# Patient Record
Sex: Female | Born: 1965 | Race: White | Hispanic: No | Marital: Married | Smoking: Current every day smoker
Health system: Southern US, Community
[De-identification: ages and names within clinical notes are randomized; demographics above are authoritative.]

## PROBLEM LIST (undated history)

## (undated) DIAGNOSIS — E559 Vitamin D deficiency, unspecified: Secondary | ICD-10-CM

## (undated) DIAGNOSIS — B029 Zoster without complications: Secondary | ICD-10-CM

## (undated) DIAGNOSIS — I491 Atrial premature depolarization: Secondary | ICD-10-CM

## (undated) HISTORY — PX: TRANSTHORACIC ECHOCARDIOGRAM: SHX275

## (undated) HISTORY — DX: Atrial premature depolarization: I49.1

## (undated) HISTORY — DX: Vitamin D deficiency, unspecified: E55.9

## (undated) HISTORY — DX: Zoster without complications: B02.9

---

## 2001-09-03 ENCOUNTER — Emergency Department (HOSPITAL_COMMUNITY): Admission: EM | Admit: 2001-09-03 | Discharge: 2001-09-03 | Payer: Self-pay | Admitting: *Deleted

## 2001-11-27 ENCOUNTER — Encounter: Payer: Self-pay | Admitting: Obstetrics and Gynecology

## 2001-11-27 ENCOUNTER — Encounter: Payer: Self-pay | Admitting: Internal Medicine

## 2001-11-27 ENCOUNTER — Emergency Department (HOSPITAL_COMMUNITY): Admission: EM | Admit: 2001-11-27 | Discharge: 2001-11-27 | Payer: Self-pay | Admitting: Internal Medicine

## 2001-12-19 ENCOUNTER — Encounter: Payer: Self-pay | Admitting: Emergency Medicine

## 2001-12-19 ENCOUNTER — Emergency Department (HOSPITAL_COMMUNITY): Admission: EM | Admit: 2001-12-19 | Discharge: 2001-12-19 | Payer: Self-pay | Admitting: Emergency Medicine

## 2002-04-24 ENCOUNTER — Inpatient Hospital Stay (HOSPITAL_COMMUNITY): Admission: RE | Admit: 2002-04-24 | Discharge: 2002-04-25 | Payer: Self-pay | Admitting: Obstetrics & Gynecology

## 2002-04-25 ENCOUNTER — Encounter: Payer: Self-pay | Admitting: Obstetrics & Gynecology

## 2002-05-10 ENCOUNTER — Inpatient Hospital Stay (HOSPITAL_COMMUNITY): Admission: AD | Admit: 2002-05-10 | Discharge: 2002-05-12 | Payer: Self-pay | Admitting: Obstetrics & Gynecology

## 2002-05-13 ENCOUNTER — Inpatient Hospital Stay (HOSPITAL_COMMUNITY): Admission: AD | Admit: 2002-05-13 | Discharge: 2002-05-16 | Payer: Self-pay | Admitting: Obstetrics and Gynecology

## 2002-06-09 ENCOUNTER — Encounter (INDEPENDENT_AMBULATORY_CARE_PROVIDER_SITE_OTHER): Payer: Self-pay | Admitting: Specialist

## 2002-06-09 ENCOUNTER — Inpatient Hospital Stay (HOSPITAL_COMMUNITY): Admission: AD | Admit: 2002-06-09 | Discharge: 2002-06-12 | Payer: Self-pay | Admitting: *Deleted

## 2002-07-16 ENCOUNTER — Emergency Department (HOSPITAL_COMMUNITY): Admission: EM | Admit: 2002-07-16 | Discharge: 2002-07-17 | Payer: Self-pay

## 2003-05-12 ENCOUNTER — Encounter: Payer: Self-pay | Admitting: *Deleted

## 2003-05-12 ENCOUNTER — Emergency Department (HOSPITAL_COMMUNITY): Admission: EM | Admit: 2003-05-12 | Discharge: 2003-05-12 | Payer: Self-pay | Admitting: *Deleted

## 2005-03-06 ENCOUNTER — Emergency Department (HOSPITAL_COMMUNITY): Admission: EM | Admit: 2005-03-06 | Discharge: 2005-03-06 | Payer: Self-pay | Admitting: Emergency Medicine

## 2005-03-06 ENCOUNTER — Ambulatory Visit: Payer: Self-pay | Admitting: Internal Medicine

## 2007-05-21 IMAGING — CR DG NECK SOFT TISSUE
2 series · 2 of 2 positions shown · non-contrast
Comparison: None.

CLINICAL DATA: Swallowed a fish bone.

NECK SOFT TISSUES - 2 VIEW  03/06/2005:

[view not recorded (1 of 2)]
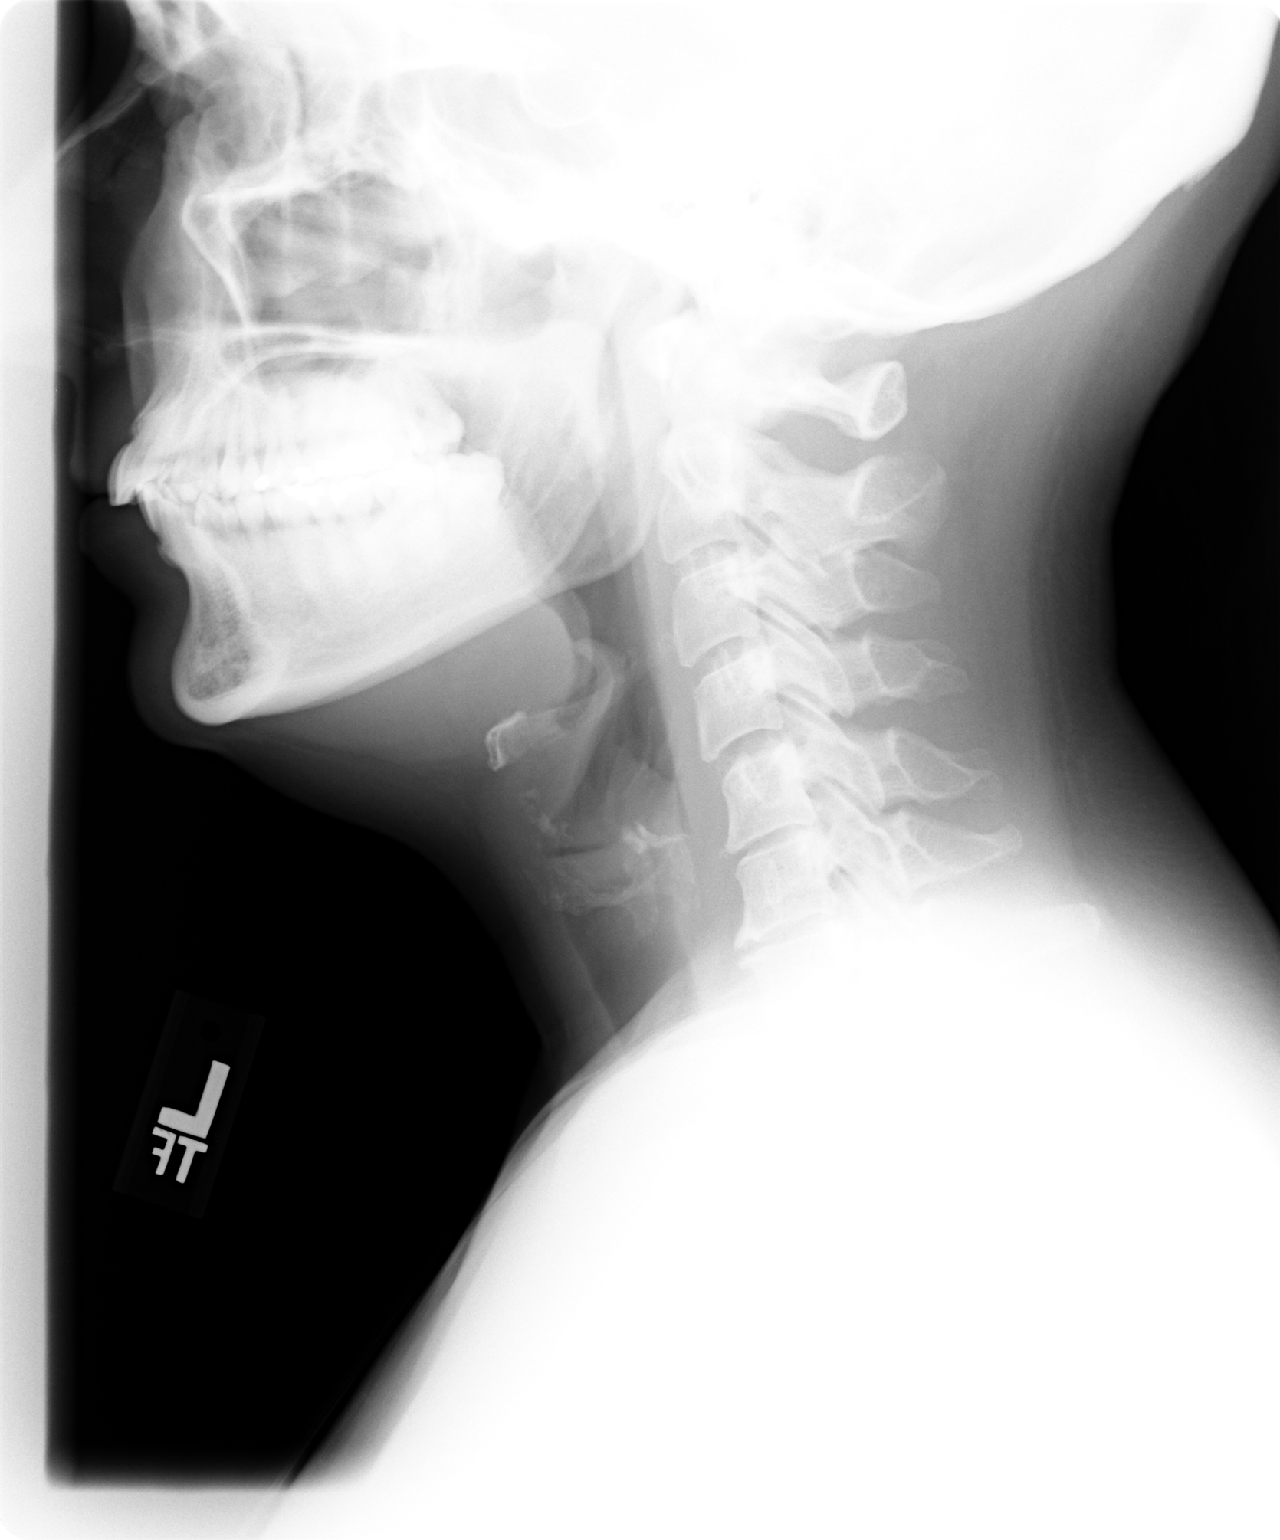

[view not recorded (2 of 2)]
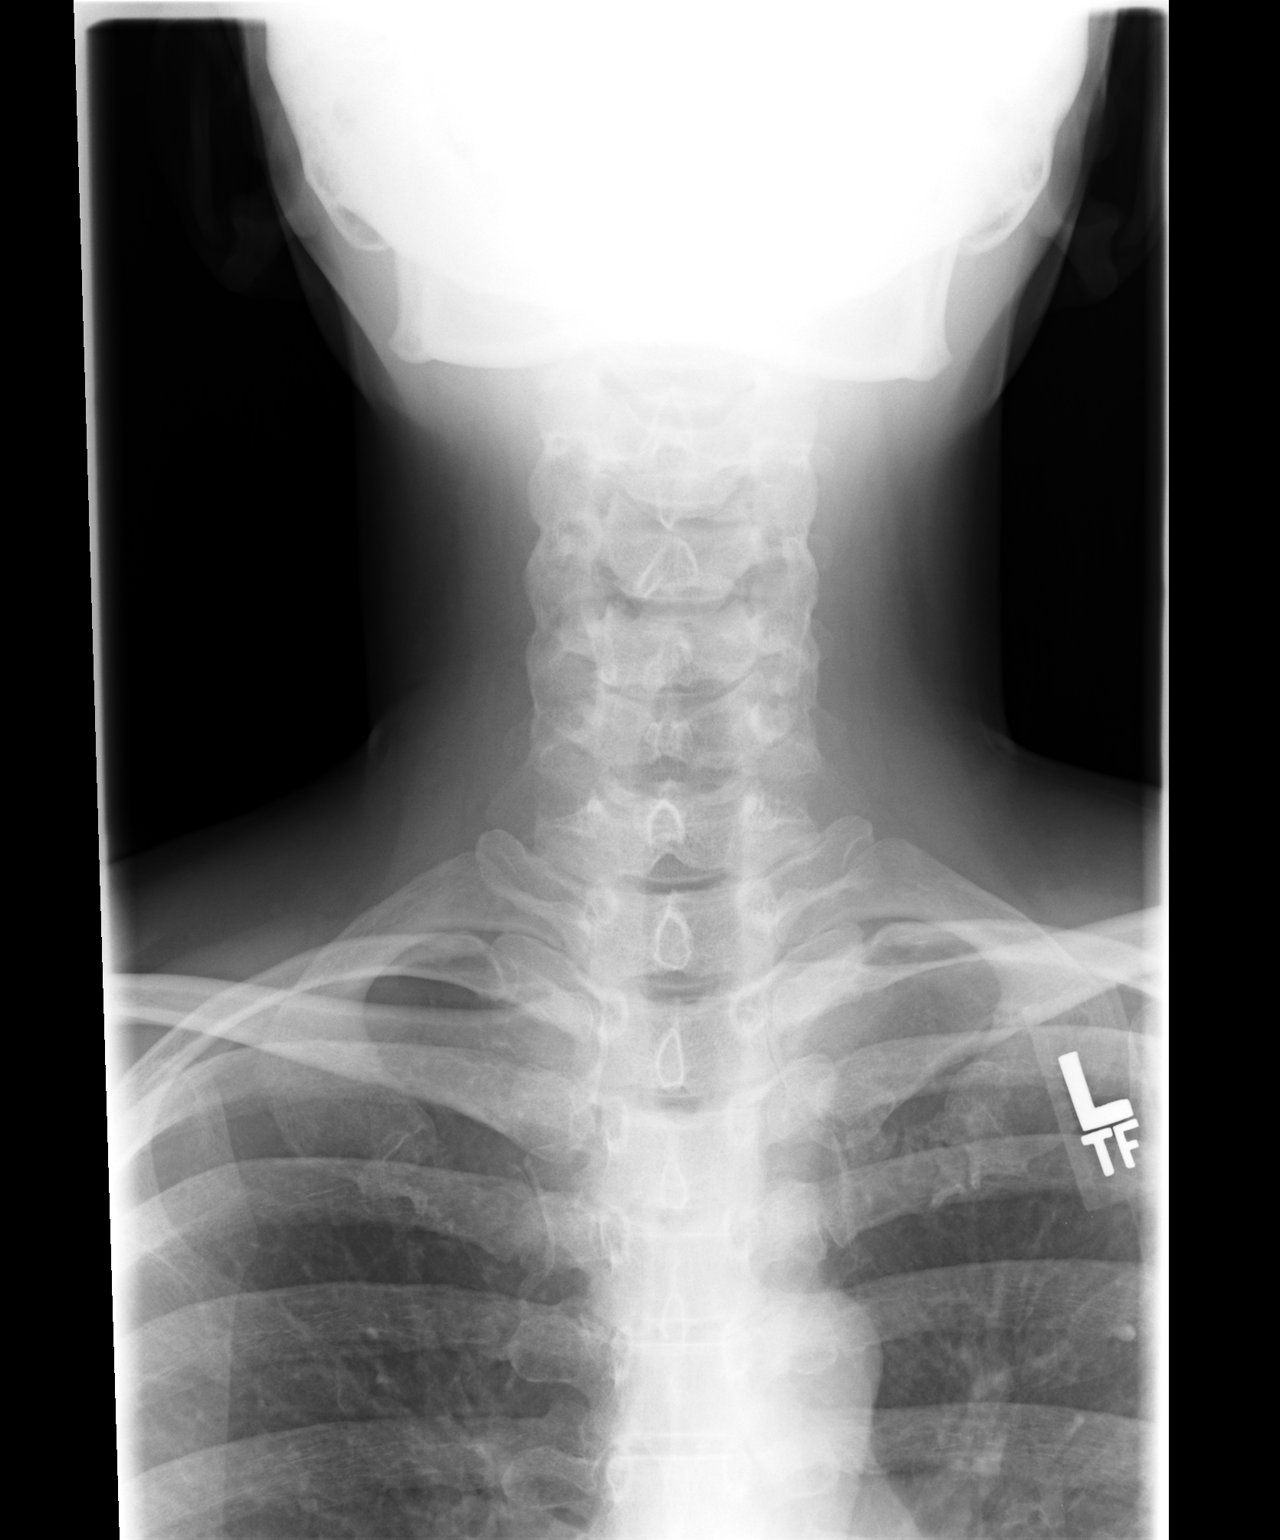

[2 of 2 positions shown; findings below may reference images not displayed]

FINDINGS: No opaque foreign bodies identified within the pharynx that would be
consistent with a fish bone. There is calcification within the arytenoid
cartilages. The prevertebral soft tissues are normal. The epiglottis is normal.
Degenerative disc disease and spondylosis are noted at C5-C6 and C6-C7.
IMPRESSION: 1. Normal soft tissues of the neck. No opaque foreign body is identified that
would be consistent with a fishbone.
2. Degenerative disc disease spondylosis at C5-C6 and C6-C7.

## 2008-04-23 ENCOUNTER — Ambulatory Visit: Payer: Self-pay | Admitting: Cardiology

## 2008-05-16 ENCOUNTER — Ambulatory Visit: Payer: Self-pay | Admitting: Cardiology

## 2008-05-21 ENCOUNTER — Ambulatory Visit: Payer: Self-pay | Admitting: Cardiology

## 2009-05-29 DIAGNOSIS — R079 Chest pain, unspecified: Secondary | ICD-10-CM

## 2009-05-29 DIAGNOSIS — I491 Atrial premature depolarization: Secondary | ICD-10-CM

## 2011-01-20 NOTE — Assessment & Plan Note (Signed)
[facility]                          [facility] OFFICE NOTE   NAME:Blanchard, Janice RAUTIO                      MRN:          [id number]  DATE:05/21/2008                            DOB:          10/06/1965    Janice Blanchard is 45 years of age.  I had seen her in consultation at  [facility] on April 24, 2008.  At that time, she had some chest  discomfort.  Her cardiac markers were normal.  She had some PACs and  PVCs.  She was extremely bothered by her palpitations.  Her echo showed  normal LV function.  It was felt that she could be discharged and that  an outpatient CardioNet monitor would help [place] assess her further.  The  patient went home and has been fully active.  She wore a CardioNet  monitor and she called in many many times with skipped beats and  palpitations.  Everyone of these episodes revealed PACs.  There was rare  atrial bigeminy.  There were rare PVCs.  There was no sustained  supraventricular tachycardia.   The patient is really very bothered by her palpitations.   The patient's husband is here with her.  They have stopped smoking since  she was in the hospital.  She is concerned about gaining weight, but she  is committed to not smoking.  This clearly increases her stress level  yet she is doing the best she can.  She has not had any significant  chest pain.  There has been no syncope or presyncope.   PAST MEDICAL HISTORY:   ALLERGIES:  No known drug allergies.   MEDICATIONS:  She is on no medicines at this time.   OTHER MEDICAL PROBLEMS:  See the list below.   REVIEW OF SYSTEMS:  Other than the HPI, her review of systems is  negative.   PHYSICAL EXAMINATION:  VITAL SIGNS:  Blood pressure is 112/78 with a  pulse of 81.  Weight is 188 pounds.  GENERAL:  The patient is oriented to person, time, and place.  Affect is  normal.  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  NECK:  She has no carotid bruits.  There is no jugular  venous  distention.  LUNGS:  Clear.  Respiratory effort is not labored.  CARDIAC:  An S1 with an S2.  There are no clicks or significant murmurs.  ABDOMEN:  Soft.  She is overweight.  She has no significant peripheral  edema.   The event recorder is outlined above.  She is sent in 6 extensive number  of reports with skipped beats and she has PACs and rare atrial bigeminy.   PROBLEMS:  1. Atypical chest discomfort with normal cardiac markers in the      hospital.  At some point, we may consider an exercise test but not      at this point.  2. Gastroesophageal reflux disease.  3. Tobacco use which she just stopped at home with her husband.  4. Anxiety.  5. Very symptomatic atrial premature beats.  I spoke with the patient  and her husband at great length.  I tried to reassure them that I      felt that this rhythm was not dangerous to her.  She should      continue full activities.  We talked about whether any medications      would help.  She is really not taking medicines.  However, I think      that she is very symptomatic.  Along with reassurance, I suggested      that we try a low dose of metoprolol succinate to see if this helps      her.  We will start this and I will see her for early followup.  6. Question of xantheloma.  We will re-review her hospital lipids and      consider outpatient lipids, if she may need treatment.   I will see her back for early followup to see how she is responding to  the metoprolol.   I spent an extensive amount of time reviewing and re-reviewing the basis  of her rhythm and talking about the treatment.     Janice Abed, MD, [facility]  Electronically Signed    JDK/MedQ  DD: 05/21/2008  DT: 05/22/2008  Job #: [id number]

## 2011-01-23 NOTE — Discharge Summary (Signed)
   NAME:  Janice Blanchard, Janice Blanchard                         ACCOUNT NO.:  [id number]   MEDICAL RECORD NO.:  [id number]                   PATIENT TYPE:  INP   LOCATION:  9309                                 FACILITY:  [facility]   PHYSICIAN:  Conni Elliot, M.D.             DATE OF BIRTH:  1965-12-30   DATE OF ADMISSION:  06/09/2002  DATE OF DISCHARGE:  06/12/2002                                 DISCHARGE SUMMARY   DISCHARGE DIAGNOSES:  1. Status post low transverse cesarean section secondary to breech     presentation with twin gestation.  2. Delivery of female twin infants.  3. Status post bilateral tubal ligation.   DISCHARGE MEDICATIONS:  1. Ibuprofen 600 mg p.o. q.6h. p.r.n. pain.  2. Prenatal vitamins one p.o. q.d. x6 weeks.  3. FeSo4 325 mg p.o. t.i.d. with meals x6 weeks.  4. Stool softener p.r.n.  5. Percocet 5/325 one to two tablets p.o. q.4h. p.r.n. severe pain.   FOLLOW UP:  The patient is to follow up in six weeks at [facility].   ADMISSION HISTORY AND PHYSICAL:  A 45 year old G58, P2-1-0-3 presenting at [redacted]  weeks gestation with a history of preterm labor, twin gestation, and breech  presentation presenting in active labor.  The patient taken for cesarean  section.   HOSPITAL COURSE:  Repeat LTCS performed under spinal anesthesia.  The  patient had no significant problems in her postoperative or postpartum  course.  She did have a postpartum tubal ligation.  The patient was  discharged to home on postoperative day three without any difficulties.  Staples were removed prior to discharge with Steri-Strips placed.   DISCHARGE LABORATORIES:  Hemoglobin 8.1, platelets 427,000, WBC 11.3.  RPR  nonreactive.   DISPOSITION:  The patient was discharged to home without further incident.     Jonah Blue, M.D.                      Conni Elliot, M.D.    Milas Gain  D:  07/02/2002  T:  07/03/2002  Job:  [id number]   cc:   [facility]

## 2011-01-23 NOTE — Op Note (Signed)
   NAME:  Janice Blanchard, Janice Blanchard                         ACCOUNT NO.:  [id number]   MEDICAL RECORD NO.:  [id number]                   PATIENT TYPE:  OIB   LOCATION:  A421                                 FACILITY:  [facility]   PHYSICIAN:  Conni Elliot, M.D.             DATE OF BIRTH:  [birth year]   DATE OF PROCEDURE:  06/09/2002  DATE OF DISCHARGE:  06/09/2002                                 OPERATIVE REPORT   PREOPERATIVE DIAGNOSES:  Intrauterine pregnancy at approximately [redacted] weeks  gestation in active labor, twin gestation, breech presenting.  The patient  desires surgical sterilization.   POSTOPERATIVE DIAGNOSES:  Intrauterine pregnancy at approximately [redacted] weeks  gestation in active labor, twin gestation, breech presenting.  The patient  desires surgical sterilization.   OPERATION:  1. Repeat cesarean delivery.  2. Modified bilateral Pomeroy tubal ligation.   SURGEON:  Conni Elliot, M.D., Phil D. Okey Dupre, M.D.   ANESTHESIA:  The patient received spinal anesthetic.   OPERATIVE FINDINGS:  Baby A was a female with Apgars of 8 and 9.  Baby B was  a female with Apgars of 8 and 9.   OPERATIVE PROCEDURE:  The patient was taken to the operating room in active  labor.  Spinal anesthetic was placed.  The patient supine left lateral tilt  position receiving oxygen.  Abdomen was prepped and draped in sterile  fashion.  Low transverse Pfannenstiel incision was made through skin and  subcutaneous fascia.  Rectus muscles separated in midline.  Peritoneal  cavity entered.  Bladder flap created.  Low transverse uterine incision was  made.  Baby A was delivered from breech presentation without difficulty.  No  entrapment of fetal head.  Apgars were 8 and 9.  Second baby was identified  and brought into the operative field and was delivered vertex without  difficulty.  Apgars of 8 and 9.  Placenta was delivered spontaneously.  Uterus and bladder flap closed in routine fashion.  We then turned to  the  tubal sterilization.  The right fallopian tube was identified, grasped with  Babcock clamp, followed out to fimbriated end.  Segment of tube brought into  operative field, double suture ligated, 1-2 cm segment was excised.  Hemostasis adequate.  Similar procedure done on the opposite side.  Hemostasis again adequate.  Anterior peritoneal fascia, subcutaneous, and  skin  closed in routine fashion.  Estimated blood loss approximately 1000  cc.                                               Conni Elliot, M.D.    ASG/MEDQ  D:  06/29/2002  T:  06/29/2002  Job:  [id number]

## 2011-01-23 NOTE — H&P (Signed)
   NAME:  Janice Blanchard, Janice Blanchard                         ACCOUNT NO.:  [id number]   MEDICAL RECORD NO.:  [id number]                   PATIENT TYPE:  INP   LOCATION:  9309                                 [facility]ACILITY[facility]  [facility]   PHYSICIAN:  Lazaro Arms, M.D.                DATE OF BIRTH:  1966-09-02   DATE OF ADMISSION:  06/09/2002  DATE OF DISCHARGE:  06/12/2002                                HISTORY & PHYSICAL   HISTORY OF PRESENT ILLNESS:  The patient is a 45 year old white female,  gravida 4, para 3, with an estimated date of delivery of 07/26/02 currently  at [redacted] weeks gestation, who has twins.  The patient has been admitted several  times with premature labor and, in fact, that started at 26 weeks.  She has  been in these in 3 separate occasions.  She comes in this morning laboring  at 4 cm.  She is a previous C-section x1.  She has been taking her  terbutaline and her contractions are regular.  She is intact and, because of  early gestation she is being transferred to [facility] and that has  been approved by Dr. Corky Sox, who is the head of Maternal Fetal  Medicine there.   PAST MEDICAL HISTORY:  Benign.   PAST SURGICAL HISTORY:  She had C-section in 1992.   ALLERGIES:  None.   PAST OBSTETRICAL HISTORY:  She has 2 vaginal deliveries and the 1 C-section  for breach.   REVIEW OF SYSTEMS:  Otherwise negative.   PHYSICAL EXAMINATION:  HEENT:  Unremarkable.  Thyroid is normal.  LUNGS:  Clear.  HEART:  Regular rate and rhythm without regurg or gallop.  BREASTS:  Deferred.  ABDOMEN:  Gravid, fundal height of 42 cm.  Cervix is 450 and 0 station.  EXTREMITIES:  With no edema.  NEUROLOGIC:  Status virtually intact.   IMPRESSION:  1. Intrauterine pregnancy at [redacted] weeks gestation.  2. Twins.    PLAN:  The patient is being transferred to [facility] for delivery because she  is in pre-term labor and delivery is going to be  imminent.  She is a  previous C-section and that will be  discussed down at [facility].  Dr. Gavin Potters  is accepting.                                                 Lazaro Arms, M.D.    Loraine Maple  D:  06/28/2002  T:  06/29/2002  Job:  [id number]

## 2011-01-23 NOTE — Op Note (Signed)
NAME:  Janice Blanchard, Janice Blanchard               ACCOUNT NO.:  [id number]   MEDICAL RECORD NO.:  [id number]          PATIENT TYPE:  EMS   LOCATION:  ED                            FACILITY:  [facility]   PHYSICIAN:  R. Roetta Sessions, M.D. DATE OF BIRTH:  April 14, 1966   DATE OF PROCEDURE:  03/06/2005  DATE OF DISCHARGE:                                 OPERATIVE REPORT   PROCEDURE:  Esophagogastroduodenoscopy with removal of foreign body.   INDICATIONS FOR PROCEDURE:  This 45 year old lady who was doing fine until  this evening when she was eating at [employer].  Was eating some  fried flounder and felt that a bone got stuck in the base of her throat.  Although she perceives it has migrated around in her throat, it remains in  her throat.  She came to see Dr. Margretta Ditty in the emergency department.  Soft  tissue neck film was negative for foreign body.  EGD is now being done.  This approach has been discussed with the patient at length.  Potential  risks, benefits and alternatives have been reviewed and questions answered.  Please see the documentation on the medical record.   PROCEDURE NOTE:  O2 saturation, blood pressure, pulses, respirations  monitored throughout the entire procedure.  Cetacaine spray for topical  oropharyngeal anesthesia.   INSTRUMENT:  Olympus videochip system.   SEDATION:  Conscious sedation with Versed and Demerol in incremental doses.   FINDINGS:  Examination of the hypopharynx revealed a very tiny, what  appeared to be a translucent bone approximately 8-10 mm x 1.5 cm in  diameter.  It was contiguous with some mucus.  It was very subtle.  It was  brought out with a rat-tooth forceps.  Cursory examination of the esophagus  revealed significant, what appeared to be distal esophageal erosions and a  small nodule in the distal esophagus.  The stomach was full of food.  No  further attempts at complete exam were undertaken.  Patient tolerated the  procedure well.   ENDOSCOPY  IMPRESSION:  1.  Tiny fish bone removed from the hypopharynx, as described above.  2.  Small nodule, distal esophagus, inflammatory changes consistent with      erosive reflux esophagitis.  3.  Stomach full of food, precluding a complete exam.   RECOMMENDATIONS:  1.  Chloraseptic spray as needed for sore throat over the next couple of      days.  2.  Patient really should come back for a complete examination and biopsy of      the nodule in the distal esophagus.  I will make arrangements for her to      have this done in the next couple of weeks.       RMR/MEDQ  D:  03/06/2005  T:  03/07/2005  Job:  [id number]   cc:   Annia Friendly. Loleta Chance, MD  [address]  [place]  [place] [address]  Fax: [phone]

## 2011-01-23 NOTE — Discharge Summary (Signed)
   NAME:  EMER, ONNEN                         ACCOUNT NO.:  [id number]   MEDICAL RECORD NO.:  [id number]                   PATIENT TYPE:  INP   LOCATION:  A416                                 FACILITY:  [facility]   PHYSICIAN:  Lazaro Arms, M.D.                DATE OF BIRTH:  1966/08/30   DATE OF ADMISSION:  05/12/2002  DATE OF DISCHARGE:  05/16/2002                                 DISCHARGE SUMMARY   DISCHARGE DIAGNOSES:  1. Intrauterine pregnancy at 29.[redacted] weeks gestation.  2. Twins.  3. Preterm labor.   PROCEDURES:  1. Admission to the hospital.  2. Interpretation of NSTs.  3. Daily care and discharge care.  4. Magnesium tocolysis.   Please refer to the transcribed History and Physical and the antepartum  chart for details of admission to the hospital.   HOSPITAL COURSE:  The patient was admitted on May 13, 2002 with  recurrent preterm labor.  She had been on terbutaline at home and also been  on magnesium sulfate previously.  She came back in about 12 hours post  discharge and once again had contractions.  She had to be put back on  magnesium and she responded and was placed back on Brethine again, and her  cervix remained stable at a fingertip, pretty good thick, and -3.  The twins  were reactive.   DISPOSITION:  She was discharged home on p.o. terbutaline.                                               Lazaro Arms, M.D.    Loraine Maple  D:  07/06/2002  T:  07/07/2002  Job:  [id number]

## 2011-01-23 NOTE — H&P (Signed)
   NAME:  MIRAL, HOOPES                         ACCOUNT NO.:  [id number]   MEDICAL RECORD NO.:  [id number]                  PATIENT TYPE:   LOCATION:                                       FACILITY:   PHYSICIAN:  Zerita Boers, N.M.                DATE OF BIRTH:   DATE OF ADMISSION:  04/24/2002  DATE OF DISCHARGE:                                HISTORY & PHYSICAL   HISTORY OF PRESENT ILLNESS:  Janice Blanchard is a 45 year old gravida 4, para 3, with  a twin gestation at 66 weeks and some odd days, who was admitted to  observation on August 17 for preterm labor contractions.  The patient was  placed on observation and given subcu terbutaline and placed on p.o.  terbutaline with good resolution of contractions and the patient had a semi-  restful night, was somewhat jittery off the Brethine 5.  She had no  contractions during the course of the day, but at approximately 4:30 to 5  o'clock, began having regular contractions about 5-6 minutes apart, with  just mild to moderate intensity.   PAST MEDICAL HISTORY:  Negative.   PAST SURGICAL HISTORY:  Positive for a C-section in August 1992.   ALLERGIES:  She has no known allergies.   MEDICATIONS:  She is not on any medications right now except for Brethine,  which she was started on in the hospital.   PRENATAL COURSE:  Has been complicated by twin gestation and advanced  maternal age.  She has had a history of first trimester bleeding at six  weeks.  Also she refused or declined amniocentesis for an abnormal AFP.   PHYSICAL EXAMINATION:  VITAL SIGNS:  Weight is 188 pounds, vital signs are  stable.  PELVIC:  Cervix has progressed from firm, thick, posterior, and presenting  part high to soft, tight fingertip which is no change in dilatation, but  cervix has softened, and presenting part continues to be high.  Contractions  are 5-6 minutes apart.   PLAN:  Consulted with Dr. Emelda Fear.  He is in agreement to start preterm  labor protocol and  to start her on  ampicillin 2 g, then 1 g q.4h.  Also we  are going to start her back on subcu terbutaline.  If that does not resolve,  we then will progress to magnesium sulfate therapy.                                                 Zerita Boers, N.M.   DL/MEDQ  D:  16/06/9603  T:  04/24/2002  Job:  [id number]   cc:   [facility]

## 2011-01-23 NOTE — H&P (Signed)
NAME:  Janice Blanchard, Janice Blanchard                         ACCOUNT NO.:  [id number]   MEDICAL RECORD NO.:  [id number]                   PATIENT TYPE:  OIB   LOCATION:  A416                                 FACILITY:  [facility]   PHYSICIAN:  Tilda Burrow, M.D.              DATE OF BIRTH:  [birth year]   DATE OF ADMISSION:  05/12/2002  DATE OF DISCHARGE:                                HISTORY & PHYSICAL   ADMISSION DIAGNOSIS:  1. Recurrent preterm labor, twin gestation 60-weeks.  2. History of prior cesarean section for scheduled repeat cesarean section     and tubal ligation.   HISTORY OF PRESENT ILLNESS:  This 45 year old female, gravida 4, para 3, AB  0 known LMP 10/19/01 placing EDC of 11/19 with ultrasound at 19 weeks  suggesting a 16 AGC was admitted at this time after returning to labor and  delivery 12 hours after her most recent discharge from the hospital for  persistent preterm labor.  The patient was on Brethine 5 mg p.o. q.6h. at  home, but had return of her contractions shortly after arrival at home.  She  returned to labor and delivery where subcu terbutaline was ineffectual at  reducing contractions.  Cervix, fortunately was not changed from last visit.  She was admitted and placed on magnesium sulfate therapy at that time.   PAST MEDICAL HISTORY:  Benign.   PAST SURGICAL HISTORY:  Cesarean section in 08/92.   ALLERGIES:  None known.   SOCIAL HISTORY:  Married.  Lives in the area.  Has supportive family members  here.   GYNECOLOGICAL HISTORY:  Notable first delivery was at 32-36 weeks delivering  a 4 pound 10 ounce female infant, had premature lung problems as well as has  had problems throughout life, has had 3 liver transplants.  The patient has  a high anxiety level which she attributes somewhat to the first child's  problems.  This pregnancy has been marked by significantly greater  discomfort than the patient was able to easily tolerate.  She has had at  least 3  admissions for preterm labor.  Interestingly the patient had preterm  labor symptoms several times with her last pregnancy which was in 1992 but  ultimately delivered at scheduled cesarean section at term for breech  infant.   PHYSICAL EXAMINATION:   VITAL SIGNS:  Height 5 feet 3 inches, weight 180.  Blood pressure 110/60.   ABDOMEN:  Abdominal exam shows a large gravid uterus, fundal height 37 cm.  Estimated fetal weight 3 weeks ago was approximately 3 pounds each with  symmetric growth needed on ultrasound.  Fetal heart rates are both reactive  today.  External monitoring, on admission, shows regular uterine  contractions warranting intervention.   ASSESSMENT:  Pregnancy [redacted] weeks gestation, twin gestation, recurrent preterm  labor without rupture of membranes.   PLAN:  Magnesium sulfate therapy tocolysis.  Will keep the patient in  hospital until she has done well, at least 24 hours on oral agents before  consideration outpatient therapy.  If contractions do not persist do not  resolve a transfer may be necessary.                                               Tilda Burrow, M.D.    JVF/MEDQ  D:  05/14/2002  T:  05/14/2002  Job:  [id number]

## 2011-01-23 NOTE — Discharge Summary (Signed)
   NAME:  Janice Blanchard, Janice Blanchard                         ACCOUNT NO.:  [id number]   MEDICAL RECORD NO.:  [id number]                   PATIENT TYPE:  INP   LOCATION:  A417                                 FACILITY:  [facility]   PHYSICIAN:  Jacklyn Shell, CNM       DATE OF BIRTH:  1966-01-24   DATE OF ADMISSION:  04/23/2002  DATE OF DISCHARGE:  04/25/2002                                 DISCHARGE SUMMARY   ADMISSION DIAGNOSIS:  Preterm contractions.   DISCHARGE DIAGNOSES:  1. Preterm contractions.  2. Twin gestation at 26 weeks.   COURSE OF HOSPITAL STAY:  The patient received several doses of subcu  terbutaline to stop contractions that were mild in nature, identifiable by  her, and picked up on the electronic fetal monitoring.  She was placed on  p.o. terbutaline with two breakthroughs requiring one to two subcu  injections of terbutaline.  She has been on p.o. Brethine all day with  minimal uterine activity in past 12 hours.  Her labs were essentially normal  except for a potassium of 3.2 for which she received p.o. potassium while in  the hospital.  She was placed on prophylactic antibiotics and received 24  hours of Ampicillin.  An ultrasound today did assess the fetus AFI and  cervical length.  Both fetuses were normal, 95th percentile, AFI was normal,  and the cervical length was slightly shortened at 2.7 cm.  The cervix  appears to be stable from the documentation from the admitting physician as  being tight fingertip, thick, posterior, high presenting part.  There is  some question as to whether consistency change from firm to thick.  However,  it is stable today.   PLAN:  Discharge patient home after consultation with Dr. Emelda Fear.   FOLLOW-UP:  In one week for pelvic ultrasound to evaluate cervical length.   MEDICATIONS:  The patient is being sent home with prescription for Brethine  5 mg p.o. q.6h.                                               Jacklyn Shell, CNM    FC/MEDQ  D:  04/25/2002  T:  04/26/2002  Job:  [id number]

## 2011-01-23 NOTE — Discharge Summary (Signed)
   NAME:  Janice Blanchard, Janice Blanchard                         ACCOUNT NO.:  [id number]   MEDICAL RECORD NO.:  [id number]                   PATIENT TYPE:  INP   LOCATION:  A420                                 FACILITY:  [facility]   PHYSICIAN:  Lazaro Arms, M.D.                DATE OF BIRTH:  [birth year]   DATE OF ADMISSION:  05/09/2002  DATE OF DISCHARGE:  05/12/2002                                 DISCHARGE SUMMARY   DISCHARGE DIAGNOSES:  1. Intrauterine pregnancy at 28.[redacted] weeks gestation.  2. Twins.  3. Preterm labor.   PROCEDURES:  1. On May 09, 2002 admission by Dr. Christin Bach.  2. On May 10, 2002 and May 11, 2002, daily care by Dr. Despina Hidden.  3. On May 12, 2002 discharge management by Dr. Despina Hidden.   HISTORY:  Please refer to the transcribed History and Physical and the  antepartum chart for details of admission to the hospital.   HOSPITAL COURSE:  The patient was admitted with preterm labor again.  She  had been at home on terbutaline.  She is 28 weeks with twins.  The patient  was begun on magnesium sulfate tocolysis which worked.  She was then placed  on Indocin but she broke through.  She was placed on magnesium tocolysis  again for 24 hours and placed on p.o. terbutaline.  She was given Celestone  12 mg IM x2 doses separated by 24 hours.  She was also on IV Unasyn.  Group  B strep culture was negative.   DISPOSITION:  The patient was discharged May 12, 2002 having no  contractions on p.o. terbutaline.  She was given five Ambien to help her  sleep the next two nights because she has difficulty with than on  terbutaline initially, Zithromax, and Tussionex.  She will be seen back in  the office next Wednesday and she was given instructions and precautions for  return prior to that time.                                               Lazaro Arms, M.D.    Loraine Maple  D:  05/12/2002  T:  05/12/2002  Job:  [id number]

## 2011-01-23 NOTE — H&P (Signed)
[facility]  Patient:    Janice Blanchard, Janice Blanchard Visit Number: [id number] MRN: [id number]          Service Type: EMS Location: ED Attending Physician:  Cassell Smiles. Dictated by:   Christin Bach, M.D. Admit Date:  11/27/2001 Discharge Date: 11/27/2001                           History and Physical  CHIEF COMPLAINT:  Early pregnancy with passage of tissue.  HISTORY OF PRESENT ILLNESS:  A 45 year old female gravida 4, para 3, pregnancy test positive at home this past week presents to emergency room after passage of a figurative size portion of what she thought was tissue, after an otherwise uneventful pregnancy course.  Unplanned pregnancy but patient interested.  The patient had no prior bleeding, no recent intercourse, or trauma.  PAST MEDICAL HISTORY:  Benign.  SOCIAL HISTORY: Divorced, current partner also legally separated awaiting divorce.  OB HISTORY: Gravida 2-1-0-3, with 1 preterm delivery of her first child with subsequent "daughter who is all messed up".  The daughter has a severe hearing deficit and had liver transplant x 3 all within 1 month but has subsequently done well at age 65.  The other 2 children were delivered at term, with the second child requiring terbutaline at times for tocolysis but delivering at term. The third child delivered by C-section.  PAST SURGICAL HISTORY:  C-section x 1.  PHYSICAL EXAMINATION:  Reveals a nervous , generally healthy Caucasian female, alert, oriented x 3.  GYN EXAMINATION:  Per Dr. Sherwood Gambler.  Cervix closed and uterus grossly normal in size.  Ultrasound shows a twin gestation with 2 fetal poles noted and 2 yolk sacs present.  LABORATORY DATA:  Hemoglobin 13, hematocrit 36, white count 6,400. Quantitative hCG [id number] with blood type A positive.  No bleeding noted at time of ultrasound.  IMPRESSION:  Intrauterine pregnancy [redacted] weeks gestation, twin pregnancy.  Low likelihood of  miscarriage.  PLAN: 1. Prenatal vitamins initiated. 2. Prenatal 1 labs ordered.  FOLLOWUP:  Follow up in one week in our office for initiation of prenatal care which will include consideration of amniocentesis for genetic indications. The patient has multiple questions regarding need for C-section versus VBAC and encouraged to consider repeat cesarean.  The patient will consider tubal ligation at time of C-section.  The patient given information regarding amniocentesis, versus MSAFP at her age. Dictated by:   Christin Bach, M.D. Attending Physician:  Cassell Smiles DD:  11/27/01 TD:  11/28/01 Job: [id number]

## 2011-02-11 ENCOUNTER — Emergency Department (HOSPITAL_COMMUNITY)
Admission: EM | Admit: 2011-02-11 | Discharge: 2011-02-12 | Disposition: A | Discharge status: Home / Self Care | Payer: 59 | Attending: Emergency Medicine | Admitting: Emergency Medicine

## 2011-02-11 ENCOUNTER — Emergency Department (HOSPITAL_COMMUNITY): Payer: 59

## 2011-02-11 DIAGNOSIS — R220 Localized swelling, mass and lump, head: Secondary | ICD-10-CM | POA: Insufficient documentation

## 2011-02-11 DIAGNOSIS — J3489 Other specified disorders of nose and nasal sinuses: Secondary | ICD-10-CM | POA: Insufficient documentation

## 2011-02-11 DIAGNOSIS — R29810 Facial weakness: Secondary | ICD-10-CM | POA: Insufficient documentation

## 2011-02-11 DIAGNOSIS — G510 Bell's palsy: Secondary | ICD-10-CM | POA: Insufficient documentation

## 2011-02-11 DIAGNOSIS — K089 Disorder of teeth and supporting structures, unspecified: Secondary | ICD-10-CM | POA: Insufficient documentation

## 2013-04-27 IMAGING — CT CT HEAD W/O CM
1 series · 16 of 30 positions shown, 20 images · non-contrast
Comparison: None.

CLINICAL DATA: Left-sided facial numbness today.  Left upper tooth
pain and facial and gum swelling.

CT HEAD WITHOUT CONTRAST
TECHNIQUE: Contiguous axial images were obtained from the base of
the skull through the vertex without contrast.

[Series 2: headseq 4.8 h37s · axial · 0.49mm/px · z∈[+120,+283]mm · 16 of 36 slices shown, 20 images]
[im 2/36  brain]
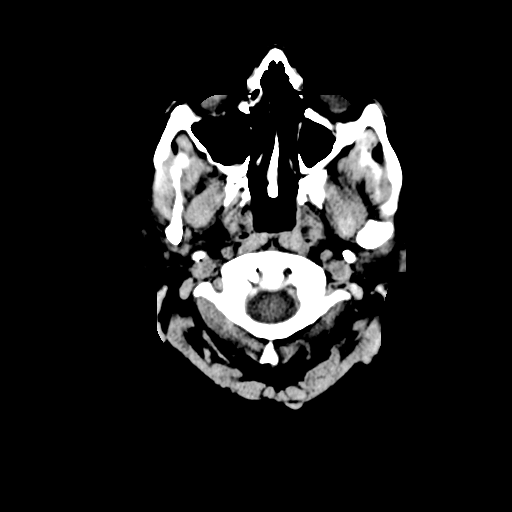
[im 2/36  bone]
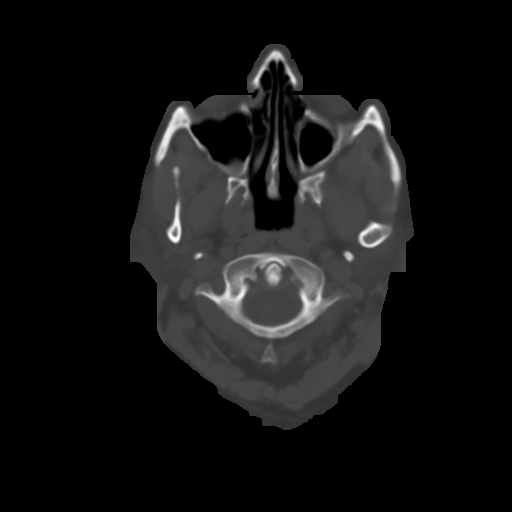
[im 4/36  brain]
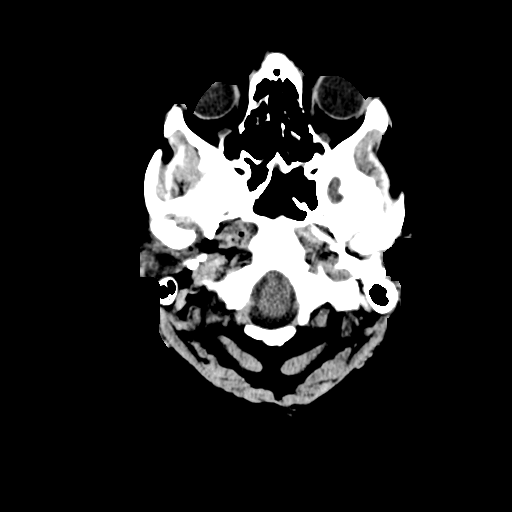
[im 7/36  brain]
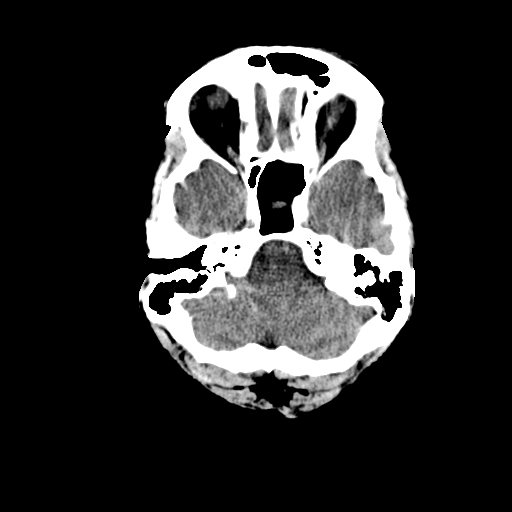
[im 9/36  brain]
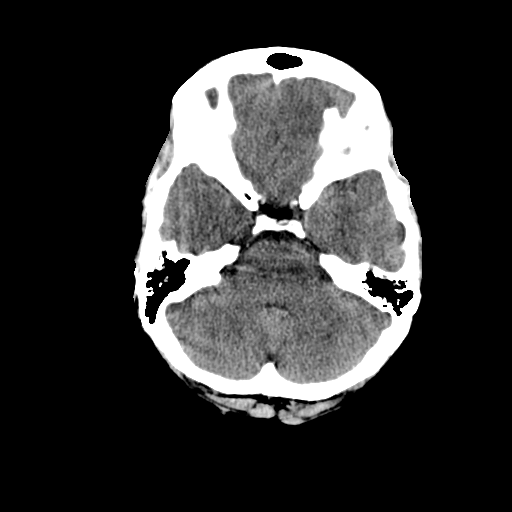
[im 10/36  brain]
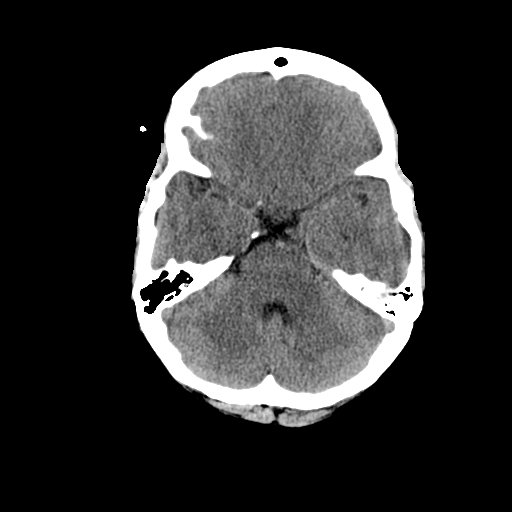
[im 10/36  bone]
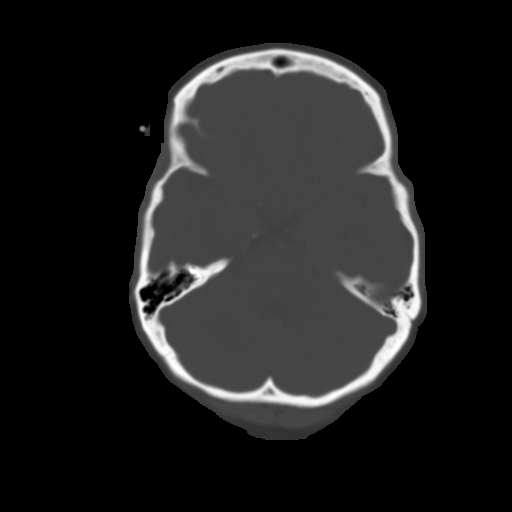
[im 13/36  brain]
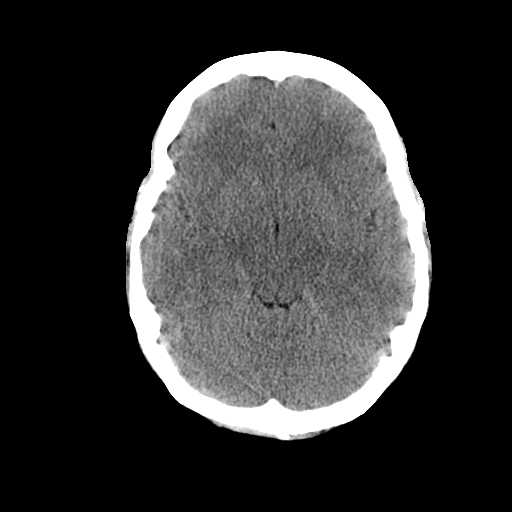
[im 15/36  brain]
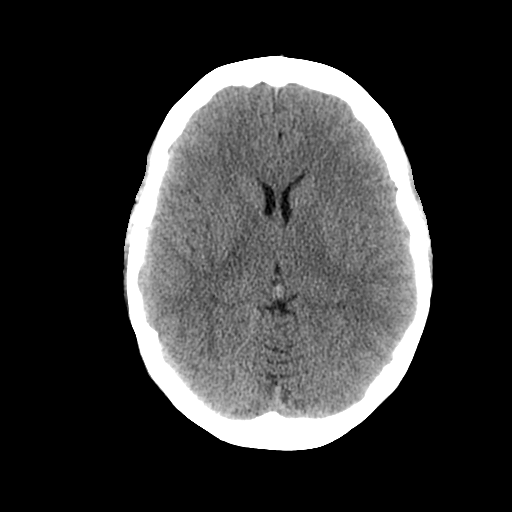
[im 17/36  brain]
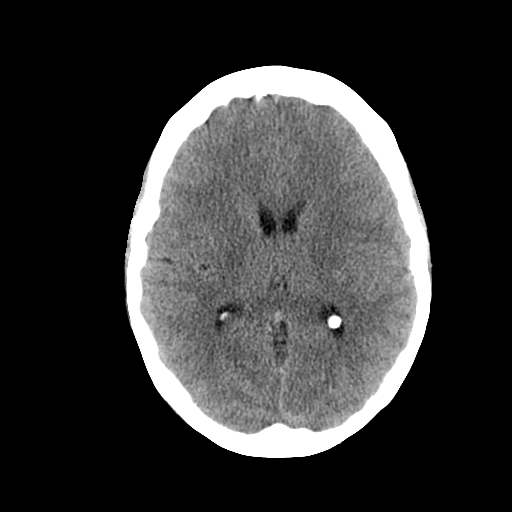
[im 19/36  brain]
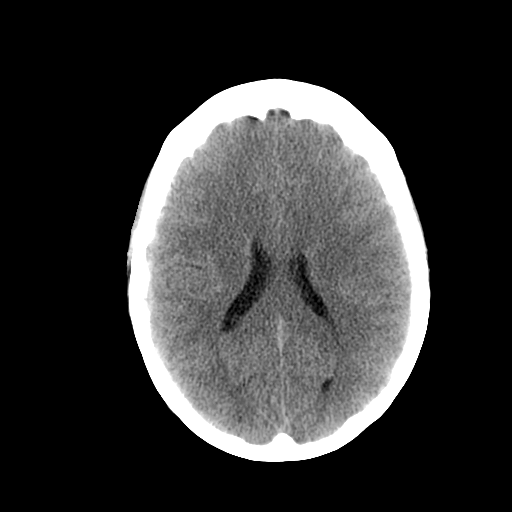
[im 19/36  bone]
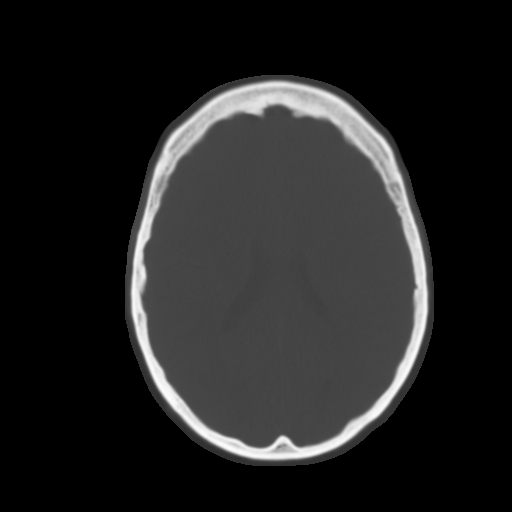
[im 21/36  brain]
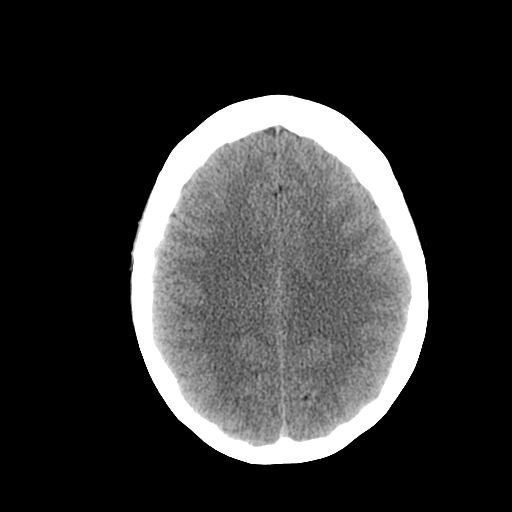
[im 23/36  brain]
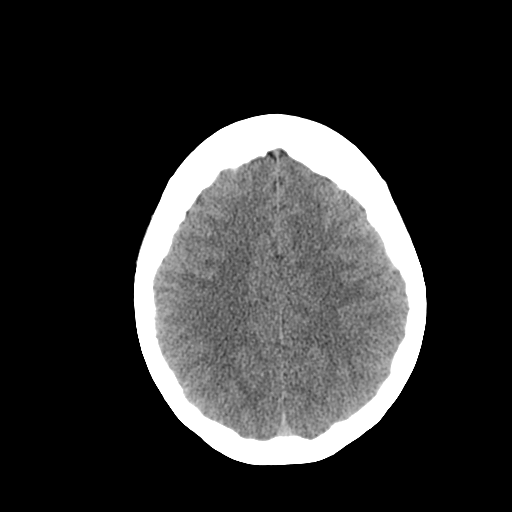
[im 26/36  brain]
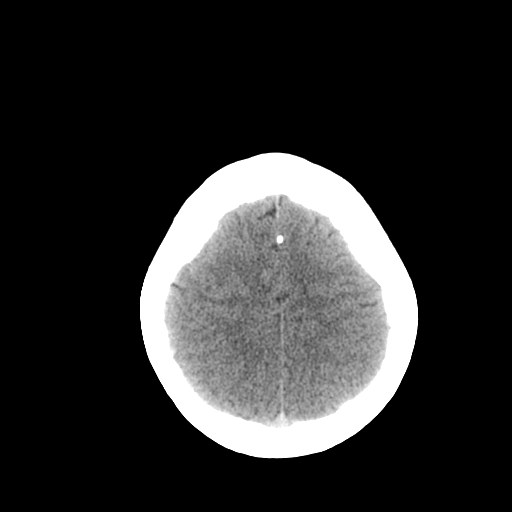
[im 27/36  brain]
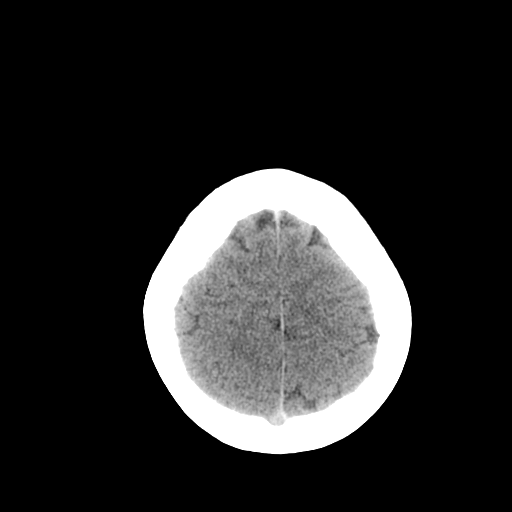
[im 27/36  bone]
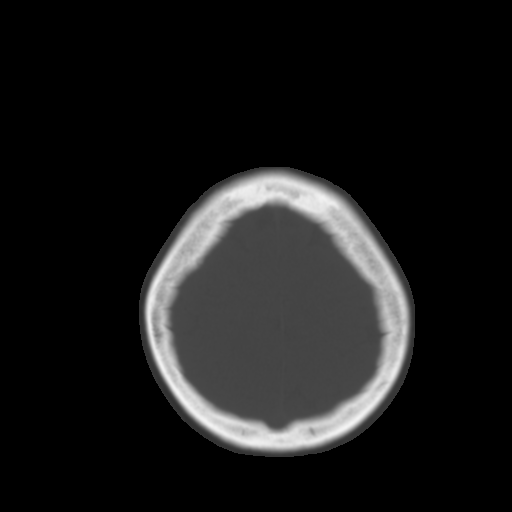
[im 29/36  brain]
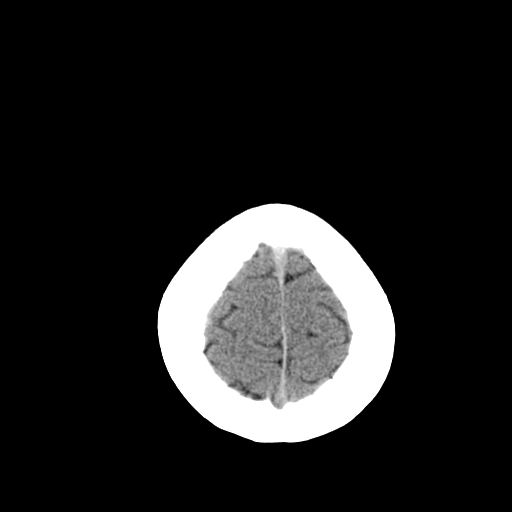
[im 32/36  brain]
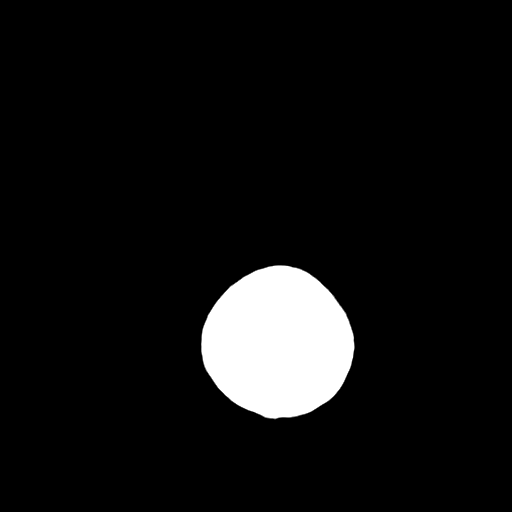
[im 34/36  brain]
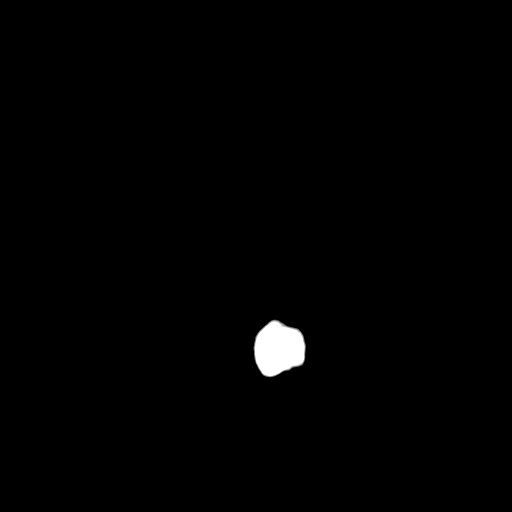

[16 of 30 positions shown; findings below may reference images not displayed]

FINDINGS: The ventricles and sulci are symmetrical without
significant effacement, displacement, or dilatation. No mass effect
or midline shift. No abnormal extra-axial fluid collections. The
grey-white matter junction is distinct. Basal cisterns are not
effaced. No acute intracranial hemorrhage. No depressed skull
fractures.  Retention cyst or polyp in the right maxillary antrum.
No acute air-fluid levels in the visualized paranasal sinuses.
IMPRESSION: No evidence of acute intracranial hemorrhage, mass lesion, or acute
infarct.  Retention cyst in the right maxillary sinus.

## 2014-01-05 ENCOUNTER — Ambulatory Visit (INDEPENDENT_AMBULATORY_CARE_PROVIDER_SITE_OTHER): Payer: 59 | Admitting: Family Medicine

## 2014-01-05 ENCOUNTER — Encounter: Payer: Self-pay | Admitting: Family Medicine

## 2014-01-05 VITALS — BP 121/82 | HR 105 | Temp 97.7°F | Resp 18 | Ht 65.5 in | Wt 196.0 lb

## 2014-01-05 DIAGNOSIS — M222X2 Patellofemoral disorders, left knee: Principal | ICD-10-CM

## 2014-01-05 DIAGNOSIS — E782 Mixed hyperlipidemia: Secondary | ICD-10-CM

## 2014-01-05 DIAGNOSIS — H0260 Xanthelasma of unspecified eye, unspecified eyelid: Secondary | ICD-10-CM

## 2014-01-05 DIAGNOSIS — Z0000 Encounter for general adult medical examination without abnormal findings: Secondary | ICD-10-CM

## 2014-01-05 DIAGNOSIS — M222X1 Patellofemoral disorders, right knee: Secondary | ICD-10-CM

## 2014-01-05 DIAGNOSIS — Z23 Encounter for immunization: Secondary | ICD-10-CM

## 2014-01-05 DIAGNOSIS — M25569 Pain in unspecified knee: Secondary | ICD-10-CM

## 2014-01-05 DIAGNOSIS — M222X9 Patellofemoral disorders, unspecified knee: Secondary | ICD-10-CM

## 2014-01-05 NOTE — Progress Notes (Signed)
Office Note 01/10/2014  CC:  Chief Complaint  Patient presents with  . Establish Care    HPI:  [name] is a 48 y.o. White female who is here to establish care. Patient's most recent primary MD: Dr. Olena Blanchard Old records were not reviewed prior to or during today's visit.  Some soft tissue bumpiness around eyelids for years now, no pain.  "I hate them".  Lipids normal in remote past when these first appeared --presumed xanthelasma.  C/o bilat knee cap discomfort, mild, feels like they are weak, esp when getting up and down and going up and down steps.  No hx of knee injury.  No swelling, erythema, or warmth.  Past Medical History  Diagnosis Date  . PAC (premature atrial contraction)     symptomatic.  Holter monitor showed PACs.  Never took meds.  Spontaneously resolved.    Past Surgical History  Procedure Laterality Date  . Cesarean section      2003 and 1992    Family History  Problem Relation Age of Onset  . Alcohol abuse Father   . Deafness Daughter   . Hepatitis Daughter     autoimmune  . Alzheimer's disease Maternal Grandfather     History   Social History  . Marital Status: Married    Spouse Name: Janice Blanchard    Number of Children: Janice Blanchard  . Years of Education: Janice Blanchard   Occupational History  . Not on file.   Social History Main Topics  . Smoking status: Former Smoker    Quit date: 01/05/2009  . Smokeless tobacco: Never Used  . Alcohol Use: No  . Drug Use: No  . Sexual Activity: Not on file   Other Topics Concern  . Not on file   Social History Narrative   Married, 5 children.   Homemaker.   Orig from [place].   Currently living in [place], [place].   20 pack-yr hx.  Quit 2009.   No alcohol or drugs.   Exercise: no formal exercise but not sedentary.   MEDS: none  No Known Allergies  ROS Review of Systems  Constitutional: Negative for fever and fatigue.  HENT: Negative for congestion and sore throat.   Eyes: Negative for visual disturbance.   Respiratory: Negative for cough.   Cardiovascular: Negative for chest pain.  Gastrointestinal: Negative for nausea and abdominal pain.  Genitourinary: Negative for dysuria.  Musculoskeletal: Negative for back pain and joint swelling.  Skin: Negative for rash.  Neurological: Negative for weakness and headaches.  Hematological: Negative for adenopathy.    PE; Blood pressure 121/82, pulse 105, temperature 97.7 F (36.5 C), temperature source Temporal, resp. rate 18, height 5' 5.5" (1.664 m), weight 196 lb (88.905 kg), SpO2 97.00%. Gen: Alert, well appearing.  Patient is oriented to person, place, time, and situation. AFFECT: pleasant, lucid thought and speech. WUJ:WJXBENT:Eyes: no injection, icteris, swelling, or exudate.  EOMI, PERRLA.  Light yellow, oblong skin lesions on upper and lower eyelids bilat, soft and nontender. Mouth: lips without lesion/swelling.  Oral mucosa pink and moist. Oropharynx without erythema, exudate, or swelling.  Neck - No masses or thyromegaly or limitation in range of motion CV: RRR, no m/r/g.   LUNGS: CTA bilat, nonlabored resps, good aeration in all lung fields. EXT: no clubbing, cyanosis, or edema.  Knee exam - both normal; full range of motion, no pain on motion, no effusion, tenderness, masses, ligamentous instability or deformity noted.  Pertinent labs:  None today  ASSESSMENT AND PLAN:   New  pt: establishing care, obtain old records.  Patellofemoral pain syndrome Discussed stretching/strengthening activities for medial quads, NSAIDs prn-sparingly.  Xanthelasma, eyelid No other areas of the body affected. Check FLP. No specific treatment rendered for this condition today.  Preventative health care Tdap IM today. Ordered health panel labs today-future, when fasting.  An After Visit Summary was printed and given to the patient.  Return for office f/u prn but needs lab appt for fasting labs at her convenience (orders entered).

## 2014-01-10 DIAGNOSIS — H0260 Xanthelasma of unspecified eye, unspecified eyelid: Secondary | ICD-10-CM | POA: Insufficient documentation

## 2014-01-10 DIAGNOSIS — M222X9 Patellofemoral disorders, unspecified knee: Secondary | ICD-10-CM | POA: Insufficient documentation

## 2014-01-10 DIAGNOSIS — Z0000 Encounter for general adult medical examination without abnormal findings: Secondary | ICD-10-CM | POA: Insufficient documentation

## 2014-01-10 NOTE — Assessment & Plan Note (Signed)
No other areas of the body affected. Check FLP. No specific treatment rendered for this condition today.

## 2014-01-10 NOTE — Assessment & Plan Note (Signed)
Discussed stretching/strengthening activities for medial quads, NSAIDs prn-sparingly.

## 2014-01-10 NOTE — Assessment & Plan Note (Addendum)
Tdap IM today. Ordered health panel labs today-future, when fasting.

## 2014-01-12 ENCOUNTER — Other Ambulatory Visit (INDEPENDENT_AMBULATORY_CARE_PROVIDER_SITE_OTHER): Payer: 59

## 2014-01-12 DIAGNOSIS — Z0000 Encounter for general adult medical examination without abnormal findings: Secondary | ICD-10-CM

## 2014-01-12 LAB — COMPREHENSIVE METABOLIC PANEL
ALBUMIN: 4 g/dL (ref 3.5–5.2)
ALK PHOS: 55 U/L (ref 39–117)
ALT: 20 U/L (ref 0–35)
AST: 19 U/L (ref 0–37)
BUN: 7 mg/dL (ref 6–23)
CO2: 28 mEq/L (ref 19–32)
Calcium: 9.3 mg/dL (ref 8.4–10.5)
Chloride: 107 mEq/L (ref 96–112)
Creatinine, Ser: 0.8 mg/dL (ref 0.4–1.2)
GFR: 84.55 mL/min (ref >60.00)
Glucose, Bld: 95 mg/dL (ref 70–99)
POTASSIUM: 4.5 meq/L (ref 3.5–5.1)
SODIUM: 141 meq/L (ref 135–145)
TOTAL PROTEIN: 6.5 g/dL (ref 6.0–8.3)
Total Bilirubin: 0.7 mg/dL (ref 0.2–1.2)

## 2014-01-12 LAB — CBC WITH DIFFERENTIAL/PLATELET
BASOS PCT: 0.5 % (ref 0.0–3.0)
Basophils Absolute: 0 10*3/uL (ref 0.0–0.1)
EOS PCT: 5.4 % — AB (ref 0.0–5.0)
Eosinophils Absolute: 0.3 10*3/uL (ref 0.0–0.7)
HEMATOCRIT: 41.6 % (ref 36.0–46.0)
Hemoglobin: 14 g/dL (ref 12.0–15.0)
LYMPHS ABS: 2.2 10*3/uL (ref 0.7–4.0)
Lymphocytes Relative: 37.5 % (ref 12.0–46.0)
MCHC: 33.7 g/dL (ref 30.0–36.0)
MCV: 99.5 fl (ref 78.0–100.0)
MONO ABS: 0.3 10*3/uL (ref 0.1–1.0)
Monocytes Relative: 6 % (ref 3.0–12.0)
Neutro Abs: 2.9 10*3/uL (ref 1.4–7.7)
Neutrophils Relative %: 50.6 % (ref 43.0–77.0)
PLATELETS: 285 10*3/uL (ref 150.0–400.0)
RBC: 4.18 Mil/uL (ref 3.87–5.11)
RDW: 13.3 % (ref 11.5–15.5)
WBC: 5.8 10*3/uL (ref 4.0–10.5)

## 2014-01-12 LAB — LIPID PANEL
Cholesterol: 190 mg/dL (ref 0–200)
HDL: 43.2 mg/dL (ref >39.00)
LDL CALC: 131 mg/dL — AB (ref 0–99)
Total CHOL/HDL Ratio: 4
Triglycerides: 79 mg/dL (ref 0.0–149.0)
VLDL: 15.8 mg/dL (ref 0.0–40.0)

## 2014-01-12 LAB — TSH: TSH: 2.38 u[IU]/mL (ref 0.35–4.50)

## 2014-01-24 ENCOUNTER — Inpatient Hospital Stay (HOSPITAL_COMMUNITY)
Admission: EM | Admit: 2014-01-24 | Discharge: 2014-01-26 | DRG: 392 | Disposition: A | Discharge status: Home / Self Care | Payer: 59 | Attending: Internal Medicine | Admitting: Internal Medicine

## 2014-01-24 ENCOUNTER — Encounter (HOSPITAL_COMMUNITY): Payer: Self-pay | Admitting: Emergency Medicine

## 2014-01-24 ENCOUNTER — Emergency Department (HOSPITAL_COMMUNITY): Payer: 59

## 2014-01-24 DIAGNOSIS — A09 Infectious gastroenteritis and colitis, unspecified: Principal | ICD-10-CM | POA: Diagnosis present

## 2014-01-24 DIAGNOSIS — Z79899 Other long term (current) drug therapy: Secondary | ICD-10-CM

## 2014-01-24 DIAGNOSIS — E782 Mixed hyperlipidemia: Secondary | ICD-10-CM | POA: Diagnosis present

## 2014-01-24 DIAGNOSIS — K644 Residual hemorrhoidal skin tags: Secondary | ICD-10-CM | POA: Diagnosis present

## 2014-01-24 DIAGNOSIS — Z820 Family history of epilepsy and other diseases of the nervous system: Secondary | ICD-10-CM

## 2014-01-24 DIAGNOSIS — R197 Diarrhea, unspecified: Secondary | ICD-10-CM

## 2014-01-24 DIAGNOSIS — K529 Noninfective gastroenteritis and colitis, unspecified: Secondary | ICD-10-CM | POA: Diagnosis present

## 2014-01-24 DIAGNOSIS — Z87891 Personal history of nicotine dependence: Secondary | ICD-10-CM

## 2014-01-24 DIAGNOSIS — H0260 Xanthelasma of unspecified eye, unspecified eyelid: Secondary | ICD-10-CM | POA: Diagnosis present

## 2014-01-24 DIAGNOSIS — R109 Unspecified abdominal pain: Secondary | ICD-10-CM

## 2014-01-24 DIAGNOSIS — K625 Hemorrhage of anus and rectum: Secondary | ICD-10-CM

## 2014-01-24 DIAGNOSIS — R079 Chest pain, unspecified: Secondary | ICD-10-CM

## 2014-01-24 DIAGNOSIS — K5900 Constipation, unspecified: Secondary | ICD-10-CM | POA: Diagnosis present

## 2014-01-24 DIAGNOSIS — Z6379 Other stressful life events affecting family and household: Secondary | ICD-10-CM

## 2014-01-24 DIAGNOSIS — K921 Melena: Secondary | ICD-10-CM

## 2014-01-24 LAB — CBC WITH DIFFERENTIAL/PLATELET
BASOS ABS: 0 10*3/uL (ref 0.0–0.1)
Basophils Relative: 0 % (ref 0–1)
EOS PCT: 1 % (ref 0–5)
Eosinophils Absolute: 0.1 10*3/uL (ref 0.0–0.7)
HEMATOCRIT: 43.5 % (ref 36.0–46.0)
Hemoglobin: 14.6 g/dL (ref 12.0–15.0)
LYMPHS PCT: 13 % (ref 12–46)
Lymphs Abs: 1.8 10*3/uL (ref 0.7–4.0)
MCH: 32.7 pg (ref 26.0–34.0)
MCHC: 33.6 g/dL (ref 30.0–36.0)
MCV: 97.5 fL (ref 78.0–100.0)
MONO ABS: 0.6 10*3/uL (ref 0.1–1.0)
MONOS PCT: 5 % (ref 3–12)
Neutro Abs: 11.7 10*3/uL — ABNORMAL HIGH (ref 1.7–7.7)
Neutrophils Relative %: 81 % — ABNORMAL HIGH (ref 43–77)
Platelets: 330 10*3/uL (ref 150–400)
RBC: 4.46 MIL/uL (ref 3.87–5.11)
RDW: 13 % (ref 11.5–15.5)
WBC: 14.3 10*3/uL — AB (ref 4.0–10.5)

## 2014-01-24 LAB — URINALYSIS, ROUTINE W REFLEX MICROSCOPIC
GLUCOSE, UA: NEGATIVE mg/dL
KETONES UR: NEGATIVE mg/dL
Leukocytes, UA: NEGATIVE
Nitrite: NEGATIVE
PROTEIN: NEGATIVE mg/dL
Specific Gravity, Urine: >1.03 — ABNORMAL HIGH (ref 1.005–1.030)
Urobilinogen, UA: 1 mg/dL (ref 0.0–1.0)
pH: 6 (ref 5.0–8.0)

## 2014-01-24 LAB — BASIC METABOLIC PANEL
BUN: 9 mg/dL (ref 6–23)
CALCIUM: 9.1 mg/dL (ref 8.4–10.5)
CO2: 24 meq/L (ref 19–32)
CREATININE: 0.64 mg/dL (ref 0.50–1.10)
Chloride: 102 mEq/L (ref 96–112)
GFR calc Af Amer: >90 mL/min (ref >90)
GFR calc non Af Amer: >90 mL/min (ref >90)
GLUCOSE: 111 mg/dL — AB (ref 70–99)
Potassium: 4.1 mEq/L (ref 3.7–5.3)
Sodium: 140 mEq/L (ref 137–147)

## 2014-01-24 LAB — URINE MICROSCOPIC-ADD ON

## 2014-01-24 LAB — POC OCCULT BLOOD, ED: Fecal Occult Bld: POSITIVE — AB

## 2014-01-24 MED ORDER — MORPHINE SULFATE 2 MG/ML IJ SOLN
2.0000 mg | INTRAMUSCULAR | Status: DC | PRN
  Administered 2014-01-25: 2 mg via INTRAVENOUS
  Administered 2014-01-25: 4 mg via INTRAVENOUS
  Administered 2014-01-25: 2 mg via INTRAVENOUS
  Filled 2014-01-24: qty 1
  Filled 2014-01-24: qty 2
  Filled 2014-01-24 (×2): qty 1

## 2014-01-24 MED ORDER — IOHEXOL 300 MG/ML  SOLN
100.0000 mL | Freq: Once | INTRAMUSCULAR | Status: AC | PRN
  Administered 2014-01-24: 100 mL via INTRAVENOUS

## 2014-01-24 MED ORDER — METRONIDAZOLE IN NACL 5-0.79 MG/ML-% IV SOLN
500.0000 mg | Freq: Three times a day (TID) | INTRAVENOUS | Status: DC
  Administered 2014-01-25: 500 mg via INTRAVENOUS
  Filled 2014-01-24 (×2): qty 100

## 2014-01-24 MED ORDER — METRONIDAZOLE IN NACL 5-0.79 MG/ML-% IV SOLN
500.0000 mg | Freq: Once | INTRAVENOUS | Status: AC
Start: 2014-01-24 — End: 2014-01-24
  Administered 2014-01-24: 500 mg via INTRAVENOUS
  Filled 2014-01-24: qty 100

## 2014-01-24 MED ORDER — ONDANSETRON HCL 4 MG/2ML IJ SOLN
4.0000 mg | Freq: Once | INTRAMUSCULAR | Status: AC
  Administered 2014-01-24: 4 mg via INTRAVENOUS
  Filled 2014-01-24: qty 2

## 2014-01-24 MED ORDER — CIPROFLOXACIN IN D5W 400 MG/200ML IV SOLN
400.0000 mg | Freq: Two times a day (BID) | INTRAVENOUS | Status: DC
  Administered 2014-01-24 – 2014-01-25 (×2): 400 mg via INTRAVENOUS
  Filled 2014-01-24 (×2): qty 200

## 2014-01-24 MED ORDER — ONDANSETRON HCL 4 MG PO TABS: 4.0000 mg | ORAL_TABLET | Freq: Four times a day (QID) | ORAL | Status: DC | PRN

## 2014-01-24 MED ORDER — FENTANYL CITRATE 0.05 MG/ML IJ SOLN
50.0000 ug | Freq: Once | INTRAMUSCULAR | Status: AC
  Administered 2014-01-24: 50 ug via INTRAVENOUS
  Filled 2014-01-24: qty 2

## 2014-01-24 MED ORDER — ONDANSETRON HCL 4 MG/2ML IJ SOLN
4.0000 mg | Freq: Four times a day (QID) | INTRAMUSCULAR | Status: DC | PRN
  Administered 2014-01-25 (×2): 4 mg via INTRAVENOUS
  Filled 2014-01-24 (×2): qty 2

## 2014-01-24 MED ORDER — METRONIDAZOLE IN NACL 5-0.79 MG/ML-% IV SOLN
500.0000 mg | Freq: Three times a day (TID) | INTRAVENOUS | Status: DC
  Filled 2014-01-24: qty 100

## 2014-01-24 MED ORDER — SODIUM CHLORIDE 0.9 % IV BOLUS (SEPSIS)
1000.0000 mL | Freq: Once | INTRAVENOUS | Status: AC
  Administered 2014-01-24: 1000 mL via INTRAVENOUS

## 2014-01-24 MED ORDER — SODIUM CHLORIDE 0.9 % IV SOLN
INTRAVENOUS | Status: DC
  Administered 2014-01-25 (×2): via INTRAVENOUS

## 2014-01-24 MED ORDER — IOHEXOL 300 MG/ML  SOLN
50.0000 mL | Freq: Once | INTRAMUSCULAR | Status: AC | PRN
  Administered 2014-01-24: 50 mL via ORAL

## 2014-01-24 MED ORDER — CIPROFLOXACIN IN D5W 400 MG/200ML IV SOLN
400.0000 mg | Freq: Once | INTRAVENOUS | Status: AC
  Administered 2014-01-24: 400 mg via INTRAVENOUS
  Filled 2014-01-24: qty 200

## 2014-01-24 NOTE — ED Notes (Signed)
MD at bedside. 

## 2014-01-24 NOTE — H&P (Signed)
Hospitalist Admission History and Physical  Patient name: Janice Blanchard record number: [id number] Date of birth: Jul 21, 1966 Age: 48 y.o. Gender: female  Primary Care Provider: Tammi Sou, MD  Chief Complaint: colitis  History of Present Illness:This is a 48 y.o. year old female with prior hx/o intermittent constipation presenting with colitis. Pt states that she has had severe abd cramping with nausea, vomiting and diarrhea over past 24 hours. Pt states that she had a similar episode approx 3-4 months that self resolved. Sxs were mainly mild abd cramping with strenuous BMs. Pt believes that sxs were due to constipation and external hemorrhoids. Sxs started yesterday with intense episodes of NBNB vomiting. Abd pain progressively worsened. Had severe lower abd cramps last night with recurring episodes of diarrhea. Some BMs with bright red blood. Denies any fevers or chills. Denies eating any supsicious foods. No recent NSAID/ASA use. Denies any history/family history of IBD.  On presentation, hemodynamically stable. Afebrile. WBC 14.3. Hgb 14.6. CT Abd shows transverse-->descending colon colitis. Pt started on cipro and flagyl for infectious coverage.   Of note, pt's daughter will be getting married on 5/22. Pt would like to possibly leave day of wedding so that she cannot miss it.     Patient Active Problem List   Diagnosis Date Noted  . Colitis 01/24/2014  . Patellofemoral pain syndrome 01/10/2014  . Xanthelasma, eyelid 01/10/2014  . Preventative health care 01/10/2014  . PREMATURE ATRIAL CONTRACTIONS 05/29/2009  . CHEST PAIN-UNSPECIFIED 05/29/2009   Past Medical History: Past Medical History  Diagnosis Date  . PAC (premature atrial contraction)     symptomatic.  Holter monitor showed PACs.  Never took meds.  Spontaneously resolved.    Past Surgical History: Past Surgical History  Procedure Laterality Date  . Cesarean section      2003 and 1992    Social  History: History   Social History  . Marital Status: Married    Spouse Name: N/A    Number of Children: N/A  . Years of Education: N/A   Social History Main Topics  . Smoking status: Former Smoker    Quit date: 01/05/2009  . Smokeless tobacco: Never Used  . Alcohol Use: No  . Drug Use: No  . Sexual Activity: None   Other Topics Concern  . None   Social History Narrative   Married, 5 children.   Homemaker.   Orig from [place].   Currently living in [place], [place].   20 pack-yr hx.  Quit 2009.   No alcohol or drugs.   Exercise: no formal exercise but not sedentary.    Family History: Family History  Problem Relation Age of Onset  . Alcohol abuse Father   . Deafness Daughter   . Hepatitis Daughter     autoimmune  . Alzheimer's disease Maternal Grandfather     Allergies: No Known Allergies  Current Facility-Administered Medications  Medication Dose Route Frequency Provider Last Rate Last Dose  . 0.9 %  sodium chloride infusion   Intravenous Continuous Shanda Howells, MD      . ciprofloxacin (CIPRO) IVPB 400 mg  400 mg Intravenous Q12H Shanda Howells, MD      . metroNIDAZOLE (FLAGYL) IVPB 500 mg  500 mg Intravenous Q8H Shanda Howells, MD      . morphine 2 MG/ML injection 2-4 mg  2-4 mg Intravenous Q2H PRN Shanda Howells, MD      . ondansetron [facility]) tablet 4 mg  4 mg Oral Q6H PRN Shanda Howells, MD  Or  . ondansetron (ZOFRAN) injection 4 mg  4 mg Intravenous Q6H PRN Shanda Howells, MD       No current outpatient prescriptions on file.   Review Of Systems: 12 point ROS negative except as noted above in HPI.  Physical Exam: Filed Vitals:   01/24/14 1834  BP: 140/93  Pulse: 92  Temp:   Resp: 18    General: alert and cooperative HEENT: PERRLA and extra ocular movement intact Heart: S1, S2 normal, no murmur, rub or gallop, regular rate and rhythm Lungs: clear to auscultation, no wheezes or rales and unlabored breathing Abdomen: + bowel sounds, + mild LLQ  TTP with guarding, + external hemorrhoids  Extremities: extremities normal, atraumatic, no cyanosis or edema Skin:no rashes, no ecchymoses Neurology: normal without focal findings  Labs and Imaging: Lab Results  Component Value Date/Time   NA 140 01/24/2014  1:03 PM   K 4.1 01/24/2014  1:03 PM   CL 102 01/24/2014  1:03 PM   CO2 24 01/24/2014  1:03 PM   BUN 9 01/24/2014  1:03 PM   CREATININE 0.64 01/24/2014  1:03 PM   GLUCOSE 111* 01/24/2014  1:03 PM   Lab Results  Component Value Date   WBC 14.3* 01/24/2014   HGB 14.6 01/24/2014   HCT 43.5 01/24/2014   MCV 97.5 01/24/2014   PLT 330 01/24/2014   Urinalysis    Component Value Date/Time   COLORURINE YELLOW 01/24/2014 1304   APPEARANCEUR CLEAR 01/24/2014 1304   LABSPEC >1.030* 01/24/2014 1304   PHURINE 6.0 01/24/2014 1304   GLUCOSEU NEGATIVE 01/24/2014 1304   HGBUR TRACE* 01/24/2014 1304   BILIRUBINUR SMALL* 01/24/2014 1304   KETONESUR NEGATIVE 01/24/2014 1304   PROTEINUR NEGATIVE 01/24/2014 1304   UROBILINOGEN 1.0 01/24/2014 1304   NITRITE NEGATIVE 01/24/2014 1304   LEUKOCYTESUR NEGATIVE 01/24/2014 1304       Ct Abdomen Pelvis W Contrast  01/24/2014   CLINICAL DATA:  Low abdomen pain with rectal bleeding and constipation.  EXAM: CT ABDOMEN AND PELVIS WITH CONTRAST  TECHNIQUE: Multidetector CT imaging of the abdomen and pelvis was performed using the standard protocol following bolus administration of intravenous contrast.  CONTRAST:  33mL OMNIPAQUE IOHEXOL 300 MG/ML SOLN, 177mL OMNIPAQUE IOHEXOL 300 MG/ML SOLN  COMPARISON:  None.  FINDINGS: There is long segment circumferential colonic wall thickening with surrounding inflammation starting at the left transverse colon near the splenic flexure extending to the distal descending colon measuring at least 18 cm in length. The remainder of the colon is normal. The small bowel is normal. The appendix is not seen but no inflammation is noted around the cecum.  There are small low-density lesions within the  liver the larger measures 6.5 mm in the injury segment right lobe liver. Views due small to characterize. The spleen, pancreas, gallbladder, adrenal glands are normal. There are small cysts within the right kidney. There is no hydronephrosis bilaterally. The left kidney is normal. The aorta is normal. Enhancement of the origins of the superior mesenteric artery, celiac artery, and inferior mesenteric artery are identified. There is no abdominal lymphadenopathy.  Fluid-filled bladder is normal. The uterus is normal. There is mild atelectasis or scar of the anterior lung bases. No acute abnormality is identified within the visualized bones.  IMPRESSION: Long segment circumferential colonic wall thickening with surrounding inflammation starting at the left transverse colon near the splenic flexure extending to the distal descending colon measuring at least 18 cm in length. The remainder of the colon is normal.  The findings are more likely due to infectious or inflammatory etiology, and less likely neoplastic etiology given the long segment involvement. Follow-up CT scan after treatment is recommended to ensure complete resolution.   Electronically Signed   By: Abelardo Diesel M.D.   On: 01/24/2014 17:26     Assessment and Plan: TAMIAH DYSART is a 48 y.o. year old female presenting with colitis   Colitis: Will continue cipro and flagyl. Stool studies including stool culture, stool O and P, fecal lactoferrin. C diff. Serial hgbs. ESR. Hemodynamically stable currently. GI consult. Suspect GI bleeding likely from ulcerated external hemorrhoids. Stepdown bed. Continue to follow closely.  FEN/GI: NPO. Hydrate.  Prophylaxis: SCDs  Disposition: Pending further evaluation Code Status: Full Code         Shanda Howells MD  Pager: [phone]

## 2014-01-24 NOTE — ED Notes (Signed)
Lower abd pain with n/v/d and rectal bleeding starting last night.

## 2014-01-24 NOTE — ED Provider Notes (Signed)
CSN: [id number]     Arrival date & time 01/24/14  1225 History  This chart was scribed for Ward GivensIva L Shalice Woodring, MD by Leone PayorSonum Patel, ED Scribe. This patient was seen in room APA02/APA02 and the patient's care was started 1:44 PM.    Chief Complaint  Patient presents with  . Abdominal Pain      The history is provided by the patient. No language interpreter was used.    HPI Comments: Janice Blanchard is a 48 y.o. female who presents to the Emergency Department complaining of sudden onset nausea and vomiting with associated abdominal pain that began 10-11 pm last night. She reports having 3 small sized BM's that progressed to watery diarrhea then to rectal bleeding that has been occurring about every 20-30 minutes since last night. She states the toilet bowel is colored bright red after each episode. She has associated fatigue and mild lightheadedness upon standing. She reports similar symptoms that occurred 4-5 months ago but states this pain is more severe with the addition of the bleeding. She did not get medical treatment at that time.  Patient reports lower abdominal pain currently. She has been drinking gatorade and has been able to tolerate this. She believes she vomits not because she becomes nauseous  when the pain become too severe to bear. Patient reports chronic constipation. She denies sick contacts or suspicious food intake. She denies hematemesis. She denies personal or family history of GI disease.    PCP [place]    Past Medical History  Diagnosis Date  . PAC (premature atrial contraction)     symptomatic.  Holter monitor showed PACs.  Never took meds.  Spontaneously resolved.   Past Surgical History  Procedure Laterality Date  . Cesarean section      2003 and 1992   Family History  Problem Relation Age of Onset  . Alcohol abuse Father   . Deafness Daughter   . Hepatitis Daughter     autoimmune  . Alzheimer's disease Maternal Grandfather    History  Substance Use Topics  .  Smoking status: Former Smoker    Quit date: 01/05/2009  . Smokeless tobacco: Never Used  . Alcohol Use: No   unemployed  OB History   Grav Para Term Preterm Abortions TAB SAB Ect Mult Living                 Review of Systems  Constitutional: Positive for fatigue. Negative for fever and chills.  Gastrointestinal: Positive for nausea, vomiting, abdominal pain, diarrhea, blood in stool and anal bleeding.  Neurological: Positive for light-headedness.  All other systems reviewed and are negative.     Allergies  Review of patient's allergies indicates no known allergies.  Home Medications   Prior to Admission medications   Not on File   BP 158/90  Pulse 98  Temp(Src) 98.5 F (36.9 C) (Oral)  Resp 16  Ht 5\' 4"  (1.626 m)  Wt 196 lb (88.905 kg)  BMI 33.63 kg/m2  SpO2 97%  Vital signs normal   Physical Exam  Nursing note and vitals reviewed. Constitutional: She is oriented to person, place, and time. She appears well-developed and well-nourished.  Non-toxic appearance. She does not appear ill. No distress.  HENT:  Head: Normocephalic and atraumatic.  Right Ear: External ear normal.  Left Ear: External ear normal.  Nose: Nose normal. No mucosal edema or rhinorrhea.  Mouth/Throat: Oropharynx is clear and moist and mucous membranes are normal. No dental abscesses or uvula swelling.  Eyes: Conjunctivae and EOM are normal. Pupils are equal, round, and reactive to light.  Neck: Normal range of motion and full passive range of motion without pain. Neck supple.  Cardiovascular: Normal rate, regular rhythm and normal heart sounds.  Exam reveals no gallop and no friction rub.   No murmur heard. Pulmonary/Chest: Effort normal and breath sounds normal. No respiratory distress. She has no wheezes. She has no rhonchi. She has no rales. She exhibits no tenderness and no crepitus.  Abdominal: Soft. Normal appearance and bowel sounds are normal. She exhibits no distension. There is  tenderness. There is no rebound and no guarding.  Mild discomfort to suprapubic region.   Genitourinary: Rectal exam shows external hemorrhoid. Guaiac positive stool.  Large hemorrhoid noted from 3 o'clock to 6 o'clock and a small hemorrhoid from 2-3 o'clock position.   Small amount of bright red blood on tip of glove on rectal exam.   Musculoskeletal: Normal range of motion. She exhibits no edema and no tenderness.  Moves all extremities well.   Neurological: She is alert and oriented to person, place, and time. She has normal strength. No cranial nerve deficit.  Skin: Skin is warm, dry and intact. No rash noted. No erythema. No pallor.  Psychiatric: She has a normal mood and affect. Her speech is normal and behavior is normal. Her mood appears not anxious.    ED Course  Procedures (including critical care time)  Medications  0.9 %  sodium chloride infusion (not administered)  sodium chloride 0.9 % bolus 1,000 mL (0 mLs Intravenous Stopped 01/24/14 1515)  fentaNYL (SUBLIMAZE) injection 50 mcg (50 mcg Intravenous Given 01/24/14 1408)  ondansetron (ZOFRAN) injection 4 mg (4 mg Intravenous Given 01/24/14 1408)  iohexol (OMNIPAQUE) 300 MG/ML solution 50 mL (50 mLs Oral Contrast Given 01/24/14 1518)  iohexol (OMNIPAQUE) 300 MG/ML solution 100 mL (100 mLs Intravenous Contrast Given 01/24/14 1641)  ciprofloxacin (CIPRO) IVPB 400 mg (0 mg Intravenous Stopped 01/24/14 1907)  metroNIDAZOLE (FLAGYL) IVPB 500 mg (0 mg Intravenous Stopped 01/24/14 1907)  fentaNYL (SUBLIMAZE) injection 50 mcg (50 mcg Intravenous Given 01/24/14 1831)     DIAGNOSTIC STUDIES: Oxygen Saturation is 97% on RA, adequate by my interpretation.    COORDINATION OF CARE: 1:44 PM Discussed treatment plan with pt at bedside and pt agreed to plan.  Patient started on IV Cipro and Flagyl after reviewing her CT scan which showed colitis. I discussed her test results with the patient. She has a wedding for her daughter at her house in 2  days. We discussed it might be better to stay in the hospital now rather than try to go home and get worse and Mr. daughter's wedding. She is agreeable to be admitted.  18:31 Dr Alvester Morin, will see patient.    Labs Review Results for orders placed during the hospital encounter of 01/24/14  URINALYSIS, ROUTINE W REFLEX MICROSCOPIC      Result Value Ref Range   Color, Urine YELLOW  YELLOW   APPearance CLEAR  CLEAR   Specific Gravity, Urine >1.030 (*) 1.005 - 1.030   pH 6.0  5.0 - 8.0   Glucose, UA NEGATIVE  NEGATIVE mg/dL   Hgb urine dipstick TRACE (*) NEGATIVE   Bilirubin Urine SMALL (*) NEGATIVE   Ketones, ur NEGATIVE  NEGATIVE mg/dL   Protein, ur NEGATIVE  NEGATIVE mg/dL   Urobilinogen, UA 1.0  0.0 - 1.0 mg/dL   Nitrite NEGATIVE  NEGATIVE   Leukocytes, UA NEGATIVE  NEGATIVE  CBC WITH DIFFERENTIAL  Result Value Ref Range   WBC 14.3 (*) 4.0 - 10.5 K/uL   RBC 4.46  3.87 - 5.11 MIL/uL   Hemoglobin 14.6  12.0 - 15.0 g/dL   HCT 16.143.5  09.636.0 - 04.546.0 %   MCV 97.5  78.0 - 100.0 fL   MCH 32.7  26.0 - 34.0 pg   MCHC 33.6  30.0 - 36.0 g/dL   RDW 40.913.0  81.111.5 - 91.415.5 %   Platelets 330  150 - 400 K/uL   Neutrophils Relative % 81 (*) 43 - 77 %   Neutro Abs 11.7 (*) 1.7 - 7.7 K/uL   Lymphocytes Relative 13  12 - 46 %   Lymphs Abs 1.8  0.7 - 4.0 K/uL   Monocytes Relative 5  3 - 12 %   Monocytes Absolute 0.6  0.1 - 1.0 K/uL   Eosinophils Relative 1  0 - 5 %   Eosinophils Absolute 0.1  0.0 - 0.7 K/uL   Basophils Relative 0  0 - 1 %   Basophils Absolute 0.0  0.0 - 0.1 K/uL  BASIC METABOLIC PANEL      Result Value Ref Range   Sodium 140  137 - 147 mEq/L   Potassium 4.1  3.7 - 5.3 mEq/L   Chloride 102  96 - 112 mEq/L   CO2 24  19 - 32 mEq/L   Glucose, Bld 111 (*) 70 - 99 mg/dL   BUN 9  6 - 23 mg/dL   Creatinine, Ser 7.820.64  0.50 - 1.10 mg/dL   Calcium 9.1  8.4 - 95.610.5 mg/dL   GFR calc non Af Amer >90  >90 mL/min   GFR calc Af Amer >90  >90 mL/min  URINE MICROSCOPIC-ADD ON      Result Value  Ref Range   Squamous Epithelial / LPF FEW (*) RARE   WBC, UA 0-2  <3 WBC/hpf   RBC / HPF 3-6  <3 RBC/hpf  POC OCCULT BLOOD, ED      Result Value Ref Range   Fecal Occult Bld POSITIVE (*) NEGATIVE    Laboratory interpretation all normal except leukocytosis and heme positive stool (grossly bloody), concentrated urine consistent with dehydration   Ct Abdomen Pelvis W Contrast  01/24/2014   CLINICAL DATA:  Low abdomen pain with rectal bleeding and constipation.  EXAM: CT ABDOMEN AND PELVIS WITH CONTRAST  TECHNIQUE: Multidetector CT imaging of the abdomen and pelvis was performed using the standard protocol following bolus administration of intravenous contrast.  CONTRAST:  50mL OMNIPAQUE IOHEXOL 300 MG/ML SOLN, 100mL OMNIPAQUE IOHEXOL 300 MG/ML SOLN  COMPARISON:  None.  FINDINGS: There is long segment circumferential colonic wall thickening with surrounding inflammation starting at the left transverse colon near the splenic flexure extending to the distal descending colon measuring at least 18 cm in length. The remainder of the colon is normal. The small bowel is normal. The appendix is not seen but no inflammation is noted around the cecum.  There are small low-density lesions within the liver the larger measures 6.5 mm in the injury segment right lobe liver. Views due small to characterize. The spleen, pancreas, gallbladder, adrenal glands are normal. There are small cysts within the right kidney. There is no hydronephrosis bilaterally. The left kidney is normal. The aorta is normal. Enhancement of the origins of the superior mesenteric artery, celiac artery, and inferior mesenteric artery are identified. There is no abdominal lymphadenopathy.  Fluid-filled bladder is normal. The uterus is normal. There is mild atelectasis or  scar of the anterior lung bases. No acute abnormality is identified within the visualized bones.  IMPRESSION: Long segment circumferential colonic wall thickening with surrounding  inflammation starting at the left transverse colon near the splenic flexure extending to the distal descending colon measuring at least 18 cm in length. The remainder of the colon is normal. The findings are more likely due to infectious or inflammatory etiology, and less likely neoplastic etiology given the long segment involvement. Follow-up CT scan after treatment is recommended to ensure complete resolution.   Electronically Signed   By: Sherian Rein M.D.   On: 01/24/2014 17:26       EKG Interpretation None      MDM   Final diagnoses:  Abdominal pain  Diarrhea  Rectal bleeding  Colitis    Plan admission   Devoria Albe, MD, Franz Dell, MD 01/24/14 2030

## 2014-01-24 NOTE — ED Notes (Signed)
Attempted to collect stool sample. A small bloody mucous bowel movement was contaminated with urine. Will attempt to collect again.

## 2014-01-24 NOTE — Progress Notes (Signed)
Per [facility], Cipro was infusing during transport and pt started to complain on itching at the IV site and a red streak was noted going up the pt's arm. Cipro infusion was stopped by [facility]. No redness noted to pt's arm at this time. No complaints of itching or pain at IV site.

## 2014-01-25 DIAGNOSIS — R197 Diarrhea, unspecified: Secondary | ICD-10-CM

## 2014-01-25 DIAGNOSIS — R079 Chest pain, unspecified: Secondary | ICD-10-CM

## 2014-01-25 DIAGNOSIS — K5289 Other specified noninfective gastroenteritis and colitis: Secondary | ICD-10-CM

## 2014-01-25 LAB — MRSA PCR SCREENING: MRSA BY PCR: NEGATIVE

## 2014-01-25 MED ORDER — FENTANYL CITRATE 0.05 MG/ML IJ SOLN
25.0000 ug | Freq: Once | INTRAMUSCULAR | Status: AC
  Administered 2014-01-25: 25 ug via INTRAVENOUS
  Filled 2014-01-25: qty 2

## 2014-01-25 MED ORDER — ACETAMINOPHEN 325 MG PO TABS
650.0000 mg | ORAL_TABLET | ORAL | Status: DC | PRN
  Administered 2014-01-25: 650 mg via ORAL
  Filled 2014-01-25: qty 2

## 2014-01-25 MED ORDER — ACETAMINOPHEN 650 MG RE SUPP: 650.0000 mg | Freq: Four times a day (QID) | RECTAL | Status: DC | PRN

## 2014-01-25 MED ORDER — METRONIDAZOLE 500 MG PO TABS
500.0000 mg | ORAL_TABLET | Freq: Three times a day (TID) | ORAL | Status: DC
  Administered 2014-01-25 – 2014-01-26 (×3): 500 mg via ORAL
  Filled 2014-01-25 (×6): qty 1

## 2014-01-25 MED ORDER — CIPROFLOXACIN HCL 500 MG PO TABS
500.0000 mg | ORAL_TABLET | Freq: Two times a day (BID) | ORAL | Status: DC
  Administered 2014-01-25 – 2014-01-26 (×2): 500 mg via ORAL
  Filled 2014-01-25 (×6): qty 1

## 2014-01-25 NOTE — Care Management Note (Unsigned)
    Page 1 of 1   01/25/2014     3:38:38 PM CARE MANAGEMENT NOTE 01/25/2014  Patient:  Janice Blanchard,Janice Blanchard   Account Number:  [id number]  Date Initiated:  01/25/2014  Documentation initiated by:  [facility],Jozlynn Plaia  Subjective/Objective Assessment:   48 year old female admitted with colitis.     Action/Plan:   From home.   Anticipated DC Date:  01/28/2014   Anticipated DC Plan:  HOME/SELF CARE      DC Planning Services  CM consult      Choice offered to / List presented to:             Status of service:  In process, will continue to follow Medicare Important Message given?   (If response is "NO", the following Medicare IM given date fields will be blank) Date Medicare IM given:   Date Additional Medicare IM given:    Discharge Disposition:    Per UR Regulation:  Reviewed for med. necessity/level of care/duration of stay  If discussed at Long Length of Stay Meetings, dates discussed:    Comments:

## 2014-01-25 NOTE — Progress Notes (Addendum)
Patient ID: Janice Blanchard, female   DOB: 1966/08/02, 48 y.o.   MRN: [id number]  [facility] PROGRESS NOTE  Janice PainGerri L Labine NWG:[id number] DOB: 1966/08/02 DOA: 01/24/2014 PCP: Jeoffrey MassedMCGOWEN,PHILIP H, MD  Brief narrative: Pt is 48 y.o. year old female with prior hx/o intermittent constipation [facility] with main concern of 24 hours duration of epigastric abdominal pain, constant and throbbing, associated with cramps, nausea, non bloody vomiting and watery diarrhea, poor oral intake. Pt explained she has had similar event 3-4 months ago and it has self resolved. She denied having any fevers, chills, no urinary concerns, no sick contacts or exposures, no recent traveling.   In ED, pt was hemodynamically, stable, Ct abd/pelvis notable for infectious etiology, colitis. Pt was started on Ciprofloxacin and Flagyl in ED. [facility] asked to admit for further evaluation.  Of note, pt's daughter will be getting married on 5/22. Pt would like to possibly leave day of wedding so that she would not miss it.   Assessment/Plan:  Active Problems:   Colitis - pt appears to be doing well, denies any specific concerns this AM - will continue ABX Ciprofloxacin and Flagyl, day #2/14, change to PO in an anticipation of discharge in AM - advance diet to ensure pt tolerating, if pt tolerating well, will plan on d/c IVF  - repeat CBC and BMP in AM - stool culture, O&P, C. Diff pending  - pt made aware that repeat CT abd will be needed to ensure resolution, CT needs to be done in 3-4 weeks - will make sure to notify her PCP, I will route this note to PCP today    Leukocytosis - secondary to colitis - ABX as noted above and repeat CBC in AM  Code Status: Full  Family Communication: Pt at bedside  Disposition Plan: Transfer to med - surge unit, order placed. Possible dc in AM if pt continues doing well.  DVT prophylaxis: SCD's  Alison MurrayAlma M Jaedin Trumbo, MD  [facility] Pager [phone]  If 7PM-7AM, please  contact night-coverage [email or web address] Password [email or web address] 01/25/2014, 9:02 AM   LOS: 1 day   Consultants:  None   Procedures:  Ct Abdomen Pelvis W Contrast   01/24/2014  Long segment circumferential colonic wall thickening with surrounding inflammation starting at the left transverse colon near the splenic flexure extending to the distal descending colon measuring at least 18 cm in length. The remainder of the colon is normal. The findings are more likely due to infectious or inflammatory etiology, and less likely neoplastic etiology given the long segment involvement. Follow-up CT scan after treatment is recommended to ensure complete resolution.    Antibiotics:  Ciprofloxacin 5/20 -->  Flagyl 5/20 -->   HPI/Subjective: No events overnight. Pt reports feeling better, tolerating ABX well. No concerns this AM   Objective: Filed Vitals:   01/25/14 0010 01/25/14 0400 01/25/14 0524 01/25/14 0800  BP: 131/64 115/52  114/64  Pulse: 85 81  86  Temp: 98.5 F (36.9 C) 98.2 F (36.8 C)    TempSrc: Oral Oral    Resp: 22 22  25   Height:      Weight:   87.6 kg (193 lb 2 oz)   SpO2: 97% 92%  90%    Intake/Output Summary (Last 24 hours) at 01/25/14 0902 Last data filed at 01/25/14 0700  Gross per 24 hour  Intake   1350 ml  Output    550 ml  Net    800 ml    Exam:  General:  Pt is alert, follows commands appropriately, not in acute distress  Cardiovascular: Regular rate and rhythm, S1/S2, no murmurs, no rubs, no gallops  Respiratory: Clear to auscultation bilaterally, no wheezing, no crackles, no rhonchi  Abdomen: Soft, slightly  Tender in epigastric area, non distended, bowel sounds present, no guarding  Extremities: No edema, pulses DP and PT palpable bilaterally  Neuro: Grossly nonfocal  Data Reviewed: Basic Metabolic Panel:  Recent Labs Lab 01/24/14 1303  NA 140  K 4.1  CL 102  CO2 24  GLUCOSE 111*  BUN 9  CREATININE 0.64  CALCIUM 9.1   CBC:  Recent  Labs Lab 01/24/14 1303  WBC 14.3*  NEUTROABS 11.7*  HGB 14.6  HCT 43.5  MCV 97.5  PLT 330   Recent Results (from the past 240 hour(s))  MRSA PCR SCREENING     Status: None   Collection Time    01/24/14 10:11 PM      Result Value Ref Range Status   MRSA by PCR NEGATIVE  NEGATIVE Final   Comment:            The GeneXpert MRSA Assay (FDA     approved for NASAL specimens     only), is one component of a     comprehensive MRSA colonization     surveillance program. It is not     intended to diagnose MRSA     infection nor to guide or     monitor treatment for     MRSA infections.    Scheduled Meds: . ciprofloxacin  400 mg Intravenous Q12H  . metronidazole  500 mg Intravenous Q8H   Continuous Infusions: . sodium chloride 125 mL/hr at 01/25/14 [phone]

## 2014-01-26 DIAGNOSIS — K921 Melena: Secondary | ICD-10-CM

## 2014-01-26 DIAGNOSIS — E782 Mixed hyperlipidemia: Secondary | ICD-10-CM

## 2014-01-26 DIAGNOSIS — H0260 Xanthelasma of unspecified eye, unspecified eyelid: Secondary | ICD-10-CM

## 2014-01-26 DIAGNOSIS — K625 Hemorrhage of anus and rectum: Secondary | ICD-10-CM

## 2014-01-26 LAB — COMPREHENSIVE METABOLIC PANEL
ALK PHOS: 57 U/L (ref 39–117)
ALT: 12 U/L (ref 0–35)
AST: 17 U/L (ref 0–37)
Albumin: 3.1 g/dL — ABNORMAL LOW (ref 3.5–5.2)
BILIRUBIN TOTAL: 0.3 mg/dL (ref 0.3–1.2)
BUN: 6 mg/dL (ref 6–23)
CHLORIDE: 108 meq/L (ref 96–112)
CO2: 23 mEq/L (ref 19–32)
Calcium: 8.4 mg/dL (ref 8.4–10.5)
Creatinine, Ser: 0.73 mg/dL (ref 0.50–1.10)
GFR calc Af Amer: >90 mL/min (ref >90)
GFR calc non Af Amer: >90 mL/min (ref >90)
Glucose, Bld: 94 mg/dL (ref 70–99)
POTASSIUM: 4 meq/L (ref 3.7–5.3)
Sodium: 141 mEq/L (ref 137–147)
Total Protein: 5.6 g/dL — ABNORMAL LOW (ref 6.0–8.3)

## 2014-01-26 LAB — CBC
HCT: 37 % (ref 36.0–46.0)
Hemoglobin: 12.3 g/dL (ref 12.0–15.0)
MCH: 32.8 pg (ref 26.0–34.0)
MCHC: 33.2 g/dL (ref 30.0–36.0)
MCV: 98.7 fL (ref 78.0–100.0)
PLATELETS: 234 10*3/uL (ref 150–400)
RBC: 3.75 MIL/uL — ABNORMAL LOW (ref 3.87–5.11)
RDW: 13 % (ref 11.5–15.5)
WBC: 8.5 10*3/uL (ref 4.0–10.5)

## 2014-01-26 MED ORDER — CIPROFLOXACIN HCL 500 MG PO TABS: 500.0000 mg | ORAL_TABLET | Freq: Two times a day (BID) | ORAL | Status: DC

## 2014-01-26 MED ORDER — METRONIDAZOLE 500 MG PO TABS: 500.0000 mg | ORAL_TABLET | Freq: Three times a day (TID) | ORAL | Status: DC

## 2014-01-26 MED ORDER — HYOSCYAMINE SULFATE 0.125 MG SL SUBL: 0.1250 mg | SUBLINGUAL_TABLET | SUBLINGUAL | Status: DC | PRN

## 2014-01-26 NOTE — Progress Notes (Signed)
Pt discharged home.  IV x 2 removed with catheter intact.  Discharge instructions reviewed with patient and pt verbalized understanding.  Visitor at bedside.  Pt discharged home and accompanied to private vehicle by visitor.

## 2014-01-26 NOTE — Discharge Instructions (Signed)
Bloody Stools Bloody stools often mean that there is a problem in the digestive tract. Your caregiver may use the term "melena" to describe black, tarry, and bad smelling stools or "hematochezia" to describe red or maroon-colored stools. Blood seen in the stool can be caused by bleeding anywhere along the intestinal tract.  A black stool usually means that blood is coming from the upper part of the gastrointestinal tract (esophagus, stomach, or small bowel). Passing maroon-colored stools or bright red blood usually means that blood is coming from lower down in the large bowel or the rectum. However, sometimes massive bleeding in the stomach or small intestine can cause bright red bloody stools.  Consuming black licorice, lead, iron pills, medicines containing bismuth subsalicylate, or blueberries can also cause black stools. Your caregiver can test black stools to see if blood is present. It is important that the cause of the bleeding be found. Treatment can then be started, and the problem can be corrected. Rectal bleeding may not be serious, but you should not assume everything is okay until you know the cause.It is very important to follow up with your caregiver or a specialist in gastrointestinal problems. CAUSES  Blood in the stools can come from various underlying causes.Often, the cause is not found during your first visit. Testing is often needed to discover the cause of bleeding in the gastrointestinal tract. Causes range from simple to serious or even life-threatening.Possible causes include:  Hemorrhoids.These are veins that are full of blood (engorged) in the rectum. They cause pain, inflammation, and may bleed.  Anal fissures.These are areas of painful tearing which may bleed. They are often caused by passing hard stool.  Diverticulosis.These are pouches that form on the colon over time, with age, and may bleed significantly.  Diverticulitis.This is inflammation in areas with  diverticulosis. It can cause pain, fever, and bloody stools, although bleeding is rare.  Proctitis and colitis. These are inflamed areas of the rectum or colon. They may cause pain, fever, and bloody stools.  Polyps and cancer. Colon cancer is a leading cause of preventable cancer death.It often starts out as precancerous polyps that can be removed during a colonoscopy, preventing progression into cancer. Sometimes, polyps and cancer may cause rectal bleeding.  Gastritis and ulcers.Bleeding from the upper gastrointestinal tract (near the stomach) may travel through the intestines and produce black, sometimes tarry, often bad smelling stools. In certain cases, if the bleeding is fast enough, the stools may not be black, but red and the condition may be life-threatening. SYMPTOMS  You may have stools that are bright red and bloody, that are normal color with blood on them, or that are dark black and tarry. In some cases, you may only have blood in the toilet bowl. Any of these cases need medical care. You may also have:  Pain at the anus or anywhere in the rectum.  Lightheadedness or feeling faint.  Extreme weakness.  Nausea or vomiting.  Fever. DIAGNOSIS Your caregiver may use the following methods to find the cause of your bleeding:  Taking a medical history. Age is important. Older people tend to develop polyps and cancer more often. If there is anal pain and a hard, large stool associated with bleeding, a tear of the anus may be the cause. If blood drips into the toilet after a bowel movement, bleeding hemorrhoids may be the problem. The color and frequency of the bleeding are additional considerations. In most cases, the medical history provides clues, but seldom the final  answer.  A visual and finger (digital) exam. Your caregiver will inspect the anal area, looking for tears and hemorrhoids. A finger exam can provide information when there is tenderness or a growth inside. In men, the  prostate is also examined.  Endoscopy. Several types of small, long scopes (endoscopes) are used to view the colon.  In the office, your caregiver may use a rigid, or more commonly, a flexible viewing sigmoidoscope. This exam is called flexible sigmoidoscopy. It is performed in 5 to 10 minutes.  A more thorough exam is accomplished with a colonoscope. It allows your caregiver to view the entire 5 to 6 foot long colon. Medicine to help you relax (sedative) is usually given for this exam. Frequently, a bleeding lesion may be present beyond the reach of the sigmoidoscope. So, a colonoscopy may be the best exam to start with. Both exams are usually done on an outpatient basis. This means the patient does not stay overnight in the hospital or surgery center.  An upper endoscopy may be needed to examine your stomach. Sedation is used and a flexible endoscope is put in your mouth, down to your stomach.  A barium enema X-ray. This is an X-ray exam. It uses liquid barium inserted by enema into the rectum. This test alone may not identify an actual bleeding point. X-rays highlight abnormal shadows, such as those made by lumps (tumors), diverticuli, or colitis. TREATMENT  Treatment depends on the cause of your bleeding.   For bleeding from the stomach or colon, the caregiver doing your endoscopy or colonoscopy may be able to stop the bleeding as part of the procedure.  Inflammation or infection of the colon can be treated with medicines.  Many rectal problems can be treated with creams, suppositories, or warm baths.  Surgery is sometimes needed.  Blood transfusions are sometimes needed if you have lost a lot of blood.  For any bleeding problem, let your caregiver know if you take aspirin or other blood thinners regularly. HOME CARE INSTRUCTIONS   Take any medicines exactly as prescribed.  Keep your stools soft by eating a diet high in fiber. Prunes (1 to 3 a day) work well for many people.  Drink  enough water and fluids to keep your urine clear or pale yellow.  Take sitz baths if advised. A sitz bath is when you sit in a bathtub with warm water for 10 to 15 minutes to soak, soothe, and cleanse the rectal area.  If enemas or suppositories are advised, be sure you know how to use them. Tell your caregiver if you have problems with this.  Monitor your bowel movements to look for signs of improvement or worsening. SEEK MEDICAL CARE IF:   You do not improve in the time expected.  Your condition worsens after initial improvement.  You develop any new symptoms. SEEK IMMEDIATE MEDICAL CARE IF:   You develop severe or prolonged rectal bleeding.  You vomit blood.  You feel weak or faint.  You have a fever. MAKE SURE YOU:  Understand these instructions.  Will watch your condition.  Will get help right away if you are not doing well or get worse. Document Released: 08/14/2002 Document Revised: 11/16/2011 Document Reviewed: 01/09/2011 [employer] Patient Information 2014 [place], [place].  Colitis Colitis is inflammation of the colon. Colitis can be a short-term or long-standing (chronic) illness. Crohn's disease and ulcerative colitis are 2 types of colitis which are chronic. They usually require lifelong treatment. CAUSES  There are many different causes  of colitis, including:  Viruses.  Germs (bacteria).  Medicine reactions. SYMPTOMS   Diarrhea.  Intestinal bleeding.  Pain.  Fever.  Throwing up (vomiting).  Tiredness (fatigue).  Weight loss.  Bowel blockage. DIAGNOSIS  The diagnosis of colitis is based on examination and stool or blood tests. X-rays, CT scan, and colonoscopy may also be needed. TREATMENT  Treatment may include:  Fluids given through the vein (intravenously).  Bowel rest (nothing to eat or drink for a period of time).  Medicine for pain and diarrhea.  Medicines (antibiotics) that kill germs.  Cortisone medicines.  Surgery. HOME  CARE INSTRUCTIONS   Get plenty of rest.  Drink enough water and fluids to keep your urine clear or pale yellow.  Eat a well-balanced diet.  Call your caregiver for follow-up as recommended. SEEK IMMEDIATE MEDICAL CARE IF:   You develop chills.  You have an oral temperature above 102 F (38.9 C), not controlled by medicine.  You have extreme weakness, fainting, or dehydration.  You have repeated vomiting.  You develop severe belly (abdominal) pain or are passing bloody or tarry stools. MAKE SURE YOU:   Understand these instructions.  Will watch your condition.  Will get help right away if you are not doing well or get worse. Document Released: 10/01/2004 Document Revised: 11/16/2011 Document Reviewed: 12/27/2009 [facility] Patient Information 2014 [place]

## 2014-01-26 NOTE — Discharge Summary (Signed)
Physician Discharge Summary  JASMYNE LODATO ONG:[id number] DOB: Dec 04, 1965 DOA: 01/24/2014  PCP: Jeoffrey Massed, MD  Admit date: 01/24/2014 Discharge date: 01/26/2014   Recommendations for Outpatient Follow-Up:   1. Recommend outpatient referral to GI for further evaluation/colonoscopy. 2. Note: Patient preferred outpatient evaluation as her daughter is getting married 01/26/14 and requested discharge.   Discharge Diagnosis:   Principal Problem:    Colitis Active Problems:    Xanthelasma, eyelid    Bloody stool    Elevated LDL cholesterol  Discharge Condition: Improved.  Diet recommendation: Low sodium, heart healthy.   History of Present Illness:   48 year old woman with a history of intermittent constipation who presented to the hospital with a chief complaint of cramping abdominal pain associated with nausea and nonbloody vomiting. Had a similar episode 3-4 months ago which resolved spontaneously. Upon initial evaluation in the ED, a CT scan of the abdomen and pelvis revealed colitis.   Hospital Course by Problem:   Principal Problem:   Colitis  CT scan of the abdomen and pelvis concerning for infectious versus inflammatory etiology.  Given patient's leukocytosis, she was started on empiric Cipro/Flagyl, which was transitioned to oral therapy 24 hours prior to discharge.  Patient symptoms have largely resolved.  She was instructed to followup with her PCP for a gastroenterology referral for further outpatient followup. Active Problems:   Xanthelasma, eyelid / dyslipidemia  Lipid panel done 01/12/14. Cholesterol 190 with an LDL of 131.  Defer to PCP regarding further treatment.   Bloody stool  Mild drop in hemoglobin noted, but no significant blood loss.  Need outpatient GI workup with colonoscopy after completion of antibiotics.  Procedures:    None.   Medical Consultants:    None.   Discharge Exam:   Filed Vitals:   01/26/14 0500  BP:  116/71  Pulse: 74  Temp: 97.8 F (36.6 C)  Resp: 20   Filed Vitals:   01/25/14 0800 01/25/14 1324 01/25/14 2107 01/26/14 0500  BP: 114/64 107/73 108/72 116/71  Pulse: 86 83 88 74  Temp: 98.3 F (36.8 C) 97.8 F (36.6 C) 98.6 F (37 C) 97.8 F (36.6 C)  TempSrc: Oral Oral Oral Oral  Resp: 25 21 20 20   Height:      Weight:    89.495 kg (197 lb 4.8 oz)  SpO2: 90% 94%  95%    Gen:  NAD Cardiovascular:  RRR, No M/R/G Respiratory: Lungs CTAB Gastrointestinal: Abdomen soft, NT/ND with normal active bowel sounds. Extremities: No C/E/C    Discharge Instructions:       Discharge Instructions   Call MD for:  persistant nausea and vomiting    Complete by:  As directed      Call MD for:  severe uncontrolled pain    Complete by:  As directed      Call MD for:  temperature >100.4    Complete by:  As directed      Diet - low sodium heart healthy    Complete by:  As directed      Discharge instructions    Complete by:  As directed   You were cared for by Dr. Hillery Aldo  (a hospitalist) during your hospital stay. If you have any questions about your discharge medications or the care you received while you were in the hospital after you are discharged, you can call the unit and ask to speak with the hospitalist on call if the hospitalist that took care of you is not  available. Once you are discharged, your primary care physician will handle any further medical issues. Please note that NO REFILLS for any discharge medications will be authorized once you are discharged, as it is imperative that you return to your primary care physician (or establish a relationship with a primary care physician if you do not have one) for your aftercare needs so that they can reassess your need for medications and monitor your lab values.  Any outstanding tests can be reviewed by your PCP at your follow up visit.  It is also important to review any medicine changes with your PCP.  Please bring these d/c  instructions with you to your next visit so your physician can review these changes with you.  If you do not have a primary care physician, you can call [phone] for a physician referral.  It is highly recommended that you obtain a PCP for hospital follow up.     Increase activity slowly    Complete by:  As directed             Medication List         ciprofloxacin 500 MG tablet  Commonly known as:  CIPRO  Take 1 tablet (500 mg total) by mouth 2 (two) times daily.     hyoscyamine 0.125 MG SL tablet  Commonly known as:  LEVSIN/SL  Place 1 tablet (0.125 mg total) under the tongue every 4 (four) hours as needed.     metroNIDAZOLE 500 MG tablet  Commonly known as:  FLAGYL  Take 1 tablet (500 mg total) by mouth every 8 (eight) hours.       Follow-up Information   Follow up with MCGOWEN,PHILIP H, MD. Schedule an appointment as soon as possible for a visit in 1 week.   Specialty:  Family Medicine   Contact information:   [address] [place] [address] [phone]        The results of significant diagnostics from this hospitalization (including imaging, microbiology, ancillary and laboratory) are listed below for reference.     Significant Diagnostic Studies:   Radiographs: Ct Abdomen Pelvis W Contrast  01/24/2014   CLINICAL DATA:  Low abdomen pain with rectal bleeding and constipation.  EXAM: CT ABDOMEN AND PELVIS WITH CONTRAST  TECHNIQUE: Multidetector CT imaging of the abdomen and pelvis was performed using the standard protocol following bolus administration of intravenous contrast.  CONTRAST:  50mL OMNIPAQUE IOHEXOL 300 MG/ML SOLN, 100mL OMNIPAQUE IOHEXOL 300 MG/ML SOLN  COMPARISON:  None.  FINDINGS: There is long segment circumferential colonic wall thickening with surrounding inflammation starting at the left transverse colon near the splenic flexure extending to the distal descending colon measuring at least 18 cm in length. The remainder of the colon is  normal. The small bowel is normal. The appendix is not seen but no inflammation is noted around the cecum.  There are small low-density lesions within the liver the larger measures 6.5 mm in the injury segment right lobe liver. Views due small to characterize. The spleen, pancreas, gallbladder, adrenal glands are normal. There are small cysts within the right kidney. There is no hydronephrosis bilaterally. The left kidney is normal. The aorta is normal. Enhancement of the origins of the superior mesenteric artery, celiac artery, and inferior mesenteric artery are identified. There is no abdominal lymphadenopathy.  Fluid-filled bladder is normal. The uterus is normal. There is mild atelectasis or scar of the anterior lung bases. No acute abnormality is identified within the visualized  bones.  IMPRESSION: Long segment circumferential colonic wall thickening with surrounding inflammation starting at the left transverse colon near the splenic flexure extending to the distal descending colon measuring at least 18 cm in length. The remainder of the colon is normal. The findings are more likely due to infectious or inflammatory etiology, and less likely neoplastic etiology given the long segment involvement. Follow-up CT scan after treatment is recommended to ensure complete resolution.   Electronically Signed   By: Sherian Rein M.D.   On: 01/24/2014 17:26    Labs:  Basic Metabolic Panel:  Recent Labs Lab 01/24/14 1303 01/26/14 0445  NA 140 141  K 4.1 4.0  CL 102 108  CO2 24 23  GLUCOSE 111* 94  BUN 9 6  CREATININE 0.64 0.73  CALCIUM 9.1 8.4   GFR Estimated Creatinine Clearance: 90.2 ml/min (by C-G formula based on Cr of 0.73). Liver Function Tests:  Recent Labs Lab 01/26/14 0445  AST 17  ALT 12  ALKPHOS 57  BILITOT 0.3  PROT 5.6*  ALBUMIN 3.1*   CBC:  Recent Labs Lab 01/24/14 1303 01/26/14 0445  WBC 14.3* 8.5  NEUTROABS 11.7*  --   HGB 14.6 12.3  HCT 43.5 37.0  MCV 97.5 98.7    PLT 330 234   Microbiology Recent Results (from the past 240 hour(s))  MRSA PCR SCREENING     Status: None   Collection Time    01/24/14 10:11 PM      Result Value Ref Range Status   MRSA by PCR NEGATIVE  NEGATIVE Final   Comment:            The GeneXpert MRSA Assay (FDA     approved for NASAL specimens     only), is one component of a     comprehensive MRSA colonization     surveillance program. It is not     intended to diagnose MRSA     infection nor to guide or     monitor treatment for     MRSA infections.    Time coordinating discharge: 40 minutes. Extensive teaching done with the patient prior to discharge.  SignedMaryruth Bun Ersel Wadleigh  Pager [phone] [facility] 01/26/2014, 8:36 AM

## 2014-01-31 ENCOUNTER — Ambulatory Visit (INDEPENDENT_AMBULATORY_CARE_PROVIDER_SITE_OTHER): Payer: 59 | Admitting: Family Medicine

## 2014-01-31 ENCOUNTER — Encounter: Payer: Self-pay | Admitting: Family Medicine

## 2014-01-31 VITALS — BP 131/85 | HR 73 | Temp 98.0°F | Resp 18 | Ht 65.5 in | Wt 191.0 lb

## 2014-01-31 DIAGNOSIS — K529 Noninfective gastroenteritis and colitis, unspecified: Secondary | ICD-10-CM

## 2014-01-31 DIAGNOSIS — K5289 Other specified noninfective gastroenteritis and colitis: Secondary | ICD-10-CM

## 2014-01-31 NOTE — Progress Notes (Signed)
Pre visit review using our clinic review tool, if applicable. No additional management support is needed unless otherwise documented below in the visit note. 

## 2014-01-31 NOTE — Progress Notes (Signed)
OFFICE NOTE  01/31/2014  CC:  Chief Complaint  Patient presents with  . Hospitalization Follow-up    abd pain     HPI: Patient is a 48 y.o. Caucasian female who is here for hosp f/u for recent admission for colitis 5/20-5/21, 2015--infectious vs inflammatory.  Pt describes acute onset of vomiting after eating, then severe lower/mid abd pain, having hard stools, then some normals, then some loose BMs.  Kept vomiting due to pain per pt report.  Continued to have rectal pain, then had blood per rectum, pain in lower abd continued.  She presented to the ED for ongoing lower abd cramping and ongoing blood per rectum. Then yesterday she had a small bM with a smear of blood, no pain.   No stool studies were done in hosp b/c she didn't have anymore stools while there. No fevers.  No muscle or joint aches/swelling.  Pertinent PMH:  Past surgical, social, and family history reviewed and no changes noted since last office visit. Chronic constipation: "pebbles every few days".  FH: diverticulitis but no known inflammatory GI problems. MEDS:  Outpatient Prescriptions Prior to Visit  Medication Sig Dispense Refill  . ciprofloxacin (CIPRO) 500 MG tablet Take 1 tablet (500 mg total) by mouth 2 (two) times daily.  24 tablet  0  . metroNIDAZOLE (FLAGYL) 500 MG tablet Take 1 tablet (500 mg total) by mouth every 8 (eight) hours.  36 tablet  0  . hyoscyamine (LEVSIN/SL) 0.125 MG SL tablet Place 1 tablet (0.125 mg total) under the tongue every 4 (four) hours as needed.  30 tablet  0   No facility-administered medications prior to visit.    PE: Blood pressure 131/85, pulse 73, temperature 98 F (36.7 C), temperature source Temporal, resp. rate 18, height 5' 5.5" (1.664 m), weight 191 lb (86.637 kg), SpO2 98.00%. Gen: Alert, well appearing.  Patient is oriented to person, place, time, and situation. CV: RRR, no m/r/g.   LUNGS: CTA bilat, nonlabored resps, good aeration in all lung fields. ABD: soft,  NT, ND, BS normal.  No hepatospenomegaly or mass.  No bruits. EXT: no clubbing, cyanosis, or edema.   LABS: none today  Recent labs: Lab Results  Component Value Date   WBC 8.5 01/26/2014   HGB 12.3 01/26/2014   HCT 37.0 01/26/2014   MCV 98.7 01/26/2014   PLT 234 01/26/2014     Chemistry      Component Value Date/Time   NA 141 01/26/2014 0445   K 4.0 01/26/2014 0445   CL 108 01/26/2014 0445   CO2 23 01/26/2014 0445   BUN 6 01/26/2014 0445   CREATININE 0.73 01/26/2014 0445      Component Value Date/Time   CALCIUM 8.4 01/26/2014 0445   ALKPHOS 57 01/26/2014 0445   AST 17 01/26/2014 0445   ALT 12 01/26/2014 0445   BILITOT 0.3 01/26/2014 0445      RECENT imaging:  CT abd/pelv with contrast 01/24/14: Impression: Long segment circumferential colonic wall thickening with  surrounding inflammation starting at the left transverse colon near  the splenic flexure extending to the distal descending colon  measuring at least 18 cm in length. The remainder of the colon is  normal. The findings are more likely due to infectious or  inflammatory etiology, and less likely neoplastic etiology given the  long segment involvement. Follow-up CT scan after treatment is  recommended to ensure complete resolution.   IMPRESSION AND PLAN:  Colitis: inflammitory vs infectious.   Finish abx. Referral  to GI: pt requests Dr. Karilyn Cota.  Spent 25 min with pt today, with >50% of this time spent in counseling and care coordination regarding the above problems.   An After Visit Summary was printed and given to the patient.  FOLLOW UP: prn

## 2014-02-14 ENCOUNTER — Encounter (INDEPENDENT_AMBULATORY_CARE_PROVIDER_SITE_OTHER): Payer: Self-pay | Admitting: *Deleted

## 2014-03-08 ENCOUNTER — Ambulatory Visit (INDEPENDENT_AMBULATORY_CARE_PROVIDER_SITE_OTHER): Payer: 59 | Admitting: Internal Medicine

## 2014-03-08 ENCOUNTER — Encounter (INDEPENDENT_AMBULATORY_CARE_PROVIDER_SITE_OTHER): Payer: Self-pay | Admitting: Internal Medicine

## 2014-03-08 VITALS — BP 104/70 | HR 84 | Temp 98.3°F | Ht 64.0 in | Wt 183.1 lb

## 2014-03-08 DIAGNOSIS — K5900 Constipation, unspecified: Secondary | ICD-10-CM | POA: Insufficient documentation

## 2014-03-08 DIAGNOSIS — K5289 Other specified noninfective gastroenteritis and colitis: Secondary | ICD-10-CM

## 2014-03-08 DIAGNOSIS — K5909 Other constipation: Secondary | ICD-10-CM

## 2014-03-08 NOTE — Progress Notes (Addendum)
Subjective:     Patient ID: Janice Blanchard, female   DOB: [birth year], 48 y.o.   MRN: [id number]  HPI 48 yr old female here today for f/u after recent admission to [facility] 01/24/2014) for colitis. She had complained of severe abd. cramping, nausea, vomiting, and constipation over the past  24 hrs. She started out with constipation and as the day progressed, she had diarrhea which was usual for her. While in hospital she was covered with Cipro and Flagyl.  She had a similar episode about 3-4 months ago which resolved.   No NSAIDs. Today she presents and states she is not having any symptoms. She does have frequent constipation. Since discharge, she stopped soft drinks and started drinking water. She did take a laxative yesterday after no BM x 4 days with good results.   .There has been no further rectal bleeding. Appetite has been good.Weight loss which was intentional.      5/202015 CT abdomen/pelvis with CM: abdominal pain, rectal bleeding, constipation. IMPRESSION:  Long segment circumferential colonic wall thickening with  surrounding inflammation starting at the left transverse colon near  the splenic flexure extending to the distal descending colon  measuring at least 18 cm in length. The remainder of the colon is  normal. The findings are more likely due to infectious or  inflammatory etiology, and less likely neoplastic etiology given the  long segment involvement. Follow-up CT scan after treatment is  recommended to ensure complete resolution.   CBC    Component Value Date/Time   WBC 8.5 01/26/2014 0445   RBC 3.75* 01/26/2014 0445   HGB 12.3 01/26/2014 0445   HCT 37.0 01/26/2014 0445   PLT 234 01/26/2014 0445   MCV 98.7 01/26/2014 0445   MCH 32.8 01/26/2014 0445   MCHC 33.2 01/26/2014 0445   RDW 13.0 01/26/2014 0445   LYMPHSABS 1.8 01/24/2014 1303   MONOABS 0.6 01/24/2014 1303   EOSABS 0.1 01/24/2014 1303   BASOSABS 0.0 01/24/2014 1303       Review of Systems Past Medical History   Diagnosis Date  . PAC (premature atrial contraction)     symptomatic.  Holter monitor showed PACs.  Never took meds.  Spontaneously resolved.    Past Surgical History  Procedure Laterality Date  . Cesarean section      2003 and 1992    No Known Allergies  No current outpatient prescriptions on file prior to visit.   No current facility-administered medications on file prior to visit.        Objective:   Physical Exam  Filed Vitals:   03/08/14 1507  BP: 104/70  Pulse: 84  Temp: 98.3 F (36.8 C)  Height: 5\' 4"  (1.626 m)  Weight: 183 lb 1.6 oz (83.054 kg)  Alert and oriented. Skin warm and dry. Oral mucosa is moist.   . Sclera anicteric, conjunctivae is pink. Thyroid not enlarged. No cervical lymphadenopathy. Lungs clear. Heart regular rate and rhythm.  Abdomen is soft. Bowel sounds are positive. No hepatomegaly. No abdominal masses felt. No tenderness.  No edema to lower extremities.  Stool brown and guaiac negative.      Assessment:    Recent hx of colitis, which has now resolved. No GI problems at this time.  Needs colonoscopy to be sure she does not have an inflammatory bowel disease.  Constipation.    Plan:     Colonoscopy. Samples of LInzess 145mcg given to patient x 4 box given to patient.

## 2014-03-08 NOTE — Patient Instructions (Signed)
Colonoscopy. Samples of Amitiza daily.

## 2014-03-23 ENCOUNTER — Encounter (INDEPENDENT_AMBULATORY_CARE_PROVIDER_SITE_OTHER): Payer: Self-pay | Admitting: *Deleted

## 2014-03-23 ENCOUNTER — Telehealth (INDEPENDENT_AMBULATORY_CARE_PROVIDER_SITE_OTHER): Payer: Self-pay | Admitting: *Deleted

## 2014-03-23 NOTE — Telephone Encounter (Signed)
Patient was seen the week I was out, I have have left 3 messages (7/8, 7/9 & 7/10), no response from patient, letter mailed today asking patient to call office to schedule TCS

## 2014-03-28 NOTE — Telephone Encounter (Signed)
Noted  

## 2014-10-09 ENCOUNTER — Telehealth: Payer: Self-pay | Admitting: Family Medicine

## 2014-10-10 NOTE — Telephone Encounter (Addendum)
Pt given new pt appt with Jannifer Rodneyhristy Hawks 11/06/14 at 8:30. Pt aware to arrive 15 minutes prior with insurance card and a list of all current medications.

## 2014-11-06 ENCOUNTER — Encounter: Payer: Self-pay | Admitting: Family

## 2014-11-06 ENCOUNTER — Ambulatory Visit (INDEPENDENT_AMBULATORY_CARE_PROVIDER_SITE_OTHER): Payer: Medicaid Other | Admitting: Family

## 2014-11-06 VITALS — BP 124/83 | HR 93 | Temp 98.4°F | Ht 64.0 in | Wt 176.8 lb

## 2014-11-06 DIAGNOSIS — Z0000 Encounter for general adult medical examination without abnormal findings: Secondary | ICD-10-CM | POA: Diagnosis not present

## 2014-11-06 MED ORDER — AMOXICILLIN 500 MG PO CAPS: 500.0000 mg | ORAL_CAPSULE | Freq: Two times a day (BID) | ORAL | Status: DC

## 2014-11-06 NOTE — Patient Instructions (Signed)

## 2014-11-06 NOTE — Addendum Note (Signed)
Addended by: Jannifer RodneyHAWKS, Zaylei Mullane A on: 11/06/2014 01:34 PM   Modules accepted: Orders

## 2014-11-06 NOTE — Progress Notes (Signed)
   Subjective:    Patient ID: Janice Blanchard, female    DOB: May 23, 1966, 49 y.o.   MRN: [id number]  HPI Pt presents to the office to establish care. Pt currently not taking any medications at this time. Pt denies any headache, palpitations, SOB, or edema at this time.     Review of Systems  Constitutional: Negative.   HENT: Negative.   Eyes: Negative.   Respiratory: Negative.  Negative for shortness of breath.   Cardiovascular: Negative.  Negative for palpitations.  Gastrointestinal: Negative.   Endocrine: Negative.   Genitourinary: Negative.   Musculoskeletal: Negative.   Neurological: Negative.  Negative for headaches.  Hematological: Negative.   Psychiatric/Behavioral: Negative.   All other systems reviewed and are negative.      Objective:   Physical Exam  Constitutional: She is oriented to person, place, and time. She appears well-developed and well-nourished. No distress.  HENT:  Head: Normocephalic and atraumatic.  Right Ear: External ear normal.  Mouth/Throat: Oropharynx is clear and moist.  Eyes: Pupils are equal, round, and reactive to light.  Neck: Normal range of motion. Neck supple. No thyromegaly present.  Cardiovascular: Normal rate, regular rhythm, normal heart sounds and intact distal pulses.   No murmur heard. Pulmonary/Chest: Effort normal and breath sounds normal. No respiratory distress. She has no wheezes.  Abdominal: Soft. Bowel sounds are normal. She exhibits no distension. There is no tenderness.  Musculoskeletal: Normal range of motion. She exhibits no edema or tenderness.  Neurological: She is alert and oriented to person, place, and time. She has normal reflexes. No cranial nerve deficit.  Skin: Skin is warm and dry.  Psychiatric: She has a normal mood and affect. Her behavior is normal. Judgment and thought content normal.  Vitals reviewed.   BP 124/83 mmHg  Pulse 93  Temp(Src) 98.4 F (36.9 C) (Oral)  Ht $R'5\' 4"'SV$  (1.626 m)  Wt 176 lb 12.8  oz (80.196 kg)  BMI 30.33 kg/m2       Assessment & Plan:  1. Laboratory tests ordered as part of a complete physical exam (CPE) - CMP14+EGFR - Lipid panel - Thyroid Panel With TSH - Vit D  25 hydroxy (rtn osteoporosis monitoring)  2. Physical exam - CMP14+EGFR - Lipid panel - Thyroid Panel With TSH - Vit D  25 hydroxy (rtn osteoporosis monitoring)   Labs pending Health Maintenance reviewed-hemoccult cards given to patient with directions Diet and exercise encouraged RTO as needed- Pt to schedule pap this year and mammogram   Evelina Dun, FNP

## 2014-11-07 ENCOUNTER — Telehealth: Payer: Self-pay | Admitting: *Deleted

## 2014-11-07 ENCOUNTER — Other Ambulatory Visit: Payer: Self-pay | Admitting: Family

## 2014-11-07 DIAGNOSIS — E785 Hyperlipidemia, unspecified: Secondary | ICD-10-CM

## 2014-11-07 DIAGNOSIS — E559 Vitamin D deficiency, unspecified: Secondary | ICD-10-CM | POA: Insufficient documentation

## 2014-11-07 LAB — LIPID PANEL
CHOLESTEROL TOTAL: 208 mg/dL — AB (ref 100–199)
Chol/HDL Ratio: 3.6 ratio units (ref 0.0–4.4)
HDL: 58 mg/dL (ref >39)
LDL Calculated: 137 mg/dL — ABNORMAL HIGH (ref 0–99)
Triglycerides: 64 mg/dL (ref 0–149)
VLDL CHOLESTEROL CAL: 13 mg/dL (ref 5–40)

## 2014-11-07 LAB — CMP14+EGFR
A/G RATIO: 1.9 (ref 1.1–2.5)
ALBUMIN: 4.3 g/dL (ref 3.5–5.5)
ALT: 29 IU/L (ref 0–32)
AST: 20 IU/L (ref 0–40)
Alkaline Phosphatase: 66 IU/L (ref 39–117)
BUN / CREAT RATIO: 19 (ref 9–23)
BUN: 13 mg/dL (ref 6–24)
Bilirubin Total: 0.5 mg/dL (ref 0.0–1.2)
CO2: 21 mmol/L (ref 18–29)
Calcium: 9.4 mg/dL (ref 8.7–10.2)
Chloride: 104 mmol/L (ref 97–108)
Creatinine, Ser: 0.69 mg/dL (ref 0.57–1.00)
GFR calc non Af Amer: 102 mL/min/{1.73_m2} (ref >59)
GFR, EST AFRICAN AMERICAN: 117 mL/min/{1.73_m2} (ref >59)
GLOBULIN, TOTAL: 2.3 g/dL (ref 1.5–4.5)
Glucose: 103 mg/dL — ABNORMAL HIGH (ref 65–99)
Potassium: 4.3 mmol/L (ref 3.5–5.2)
SODIUM: 140 mmol/L (ref 134–144)
Total Protein: 6.6 g/dL (ref 6.0–8.5)

## 2014-11-07 LAB — THYROID PANEL WITH TSH
Free Thyroxine Index: 2 (ref 1.2–4.9)
T3 UPTAKE RATIO: 24 % (ref 24–39)
T4 TOTAL: 8.5 ug/dL (ref 4.5–12.0)
TSH: 2.46 u[IU]/mL (ref 0.450–4.500)

## 2014-11-07 LAB — VITAMIN D 25 HYDROXY (VIT D DEFICIENCY, FRACTURES): VIT D 25 HYDROXY: 12.9 ng/mL — AB (ref 30.0–100.0)

## 2014-11-07 MED ORDER — SIMVASTATIN 20 MG PO TABS: 20.0000 mg | ORAL_TABLET | Freq: Every day | ORAL | Status: DC

## 2014-11-07 MED ORDER — VITAMIN D (ERGOCALCIFEROL) 1.25 MG (50000 UNIT) PO CAPS
50000.0000 [IU] | ORAL_CAPSULE | ORAL | Status: DC
Start: 2014-11-07 — End: 2018-10-19

## 2014-11-07 NOTE — Telephone Encounter (Signed)
Lm 3/2-jhb 

## 2014-11-07 NOTE — Telephone Encounter (Signed)
-----   Message from Junie Spencerhristy A Hawks, FNP sent at 11/07/2014 11:30 AM EST ----- Kidney and liver function stable Cholesterol levels elevated- Prescription sent to pharmacy  Thyroid levels WNL Vit D levels low-Prescription sent to pharmacy

## 2015-05-09 ENCOUNTER — Ambulatory Visit (INDEPENDENT_AMBULATORY_CARE_PROVIDER_SITE_OTHER): Payer: Medicaid Other | Admitting: Family Medicine

## 2015-05-09 ENCOUNTER — Encounter: Payer: Self-pay | Admitting: Family Medicine

## 2015-05-09 VITALS — BP 124/83 | HR 85 | Temp 97.9°F | Ht 64.0 in | Wt 177.4 lb

## 2015-05-09 DIAGNOSIS — E559 Vitamin D deficiency, unspecified: Secondary | ICD-10-CM | POA: Diagnosis not present

## 2015-05-09 DIAGNOSIS — E785 Hyperlipidemia, unspecified: Secondary | ICD-10-CM | POA: Diagnosis not present

## 2015-05-09 MED ORDER — PENICILLIN V POTASSIUM 500 MG PO TABS: 500.0000 mg | ORAL_TABLET | Freq: Four times a day (QID) | ORAL | Status: DC

## 2015-05-09 NOTE — Progress Notes (Signed)
   Subjective:    Patient ID: Janice Blanchard, female    DOB: 1966-07-04, 49 y.o.   MRN: [id number]  HPI 49 year old female who is here to follow-up her lipid problems. She was started on simvastatin at her last visit but she really has not taken it as prescribed, preferring to use over-the-counter medicines such as fish oil,flaxseed etc.she has xanthelasma on her eyelids and one might think it's related to cholesterol deposition but I think in her case it's probably not high enough to be related. She also complains today of toothache right side of her jaw  Patient Active Problem List   Diagnosis Date Noted  . Vitamin D deficiency 11/07/2014  . Hyperlipidemia 11/07/2014  . Other and unspecified noninfectious gastroenteritis and colitis(558.9) 03/08/2014  . Constipation 03/08/2014  . Colitis 01/24/2014  . 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 Xanthelasma, eyelid 01/10/2014  . PREMATURE ATRIAL CONTRACTIONS 05/29/2009   Outpatient Encounter Prescriptions as of 05/09/2015  Medication Sig  . Flaxseed, Linseed, (FLAX SEED OIL) 1000 MG CAPS Take 2,000 mg by mouth daily.  . Omega-3 Fatty Acids (FISH OIL) 1000 MG CAPS Take 3,000 mg by mouth daily.  . Omega-3 Krill Oil 1000 MG CAPS Take 2,000 mg by mouth daily.  . Vitamin D, Ergocalciferol, (DRISDOL) 50000 UNITS CAPS capsule Take 1 capsule (50,000 Units total) by mouth every 7 (seven) days.  . simvastatin (ZOCOR) 20 MG tablet Take 1 tablet (20 mg total) by mouth at bedtime. (Patient not taking: Reported on 05/09/2015)  . [DISCONTINUED] amoxicillin (AMOXIL) 500 MG capsule Take 1 capsule (500 mg total) by mouth 2 (two) times daily.   No facility-administered encounter medications on file as of 05/09/2015.      Review of Systems  Constitutional: Negative.   Respiratory: Negative.   Cardiovascular: Negative.   Neurological: Negative.        Objective:   Physical Exam  Constitutional: She is oriented to person, place, and time. She appears  well-developed and well-nourished.  HENT:  Head: Normocephalic.  Cardiovascular: Normal rate and regular rhythm.   Pulmonary/Chest: Effort normal and breath sounds normal.  Neurological: She is alert and oriented to person, place, and time.          Assessment & Plan:  1. Hyperlipidemia I'm not sure her lipids really qualify her to be called hyperlipidemia. Check today. When she is again to the risk assessment profile her 10 year risk is less than 7.5% which would indicate no need for treatment - Vit D  25 hydroxy (rtn osteoporosis monitoring)   2. Vitamin D deficiency Has been on 50,000 units weekly vitamin D for 6 months. Need to check and see if she is back in the normal range and perhaps continue with maintenance dose  Frederica Kuster MD -

## 2015-05-09 NOTE — Addendum Note (Signed)
Addended by: Fawn Kirk on: 05/09/2015 04:12 PM   Modules accepted: Orders, SmartSet

## 2015-05-10 LAB — LIPID PANEL
CHOL/HDL RATIO: 3.1 ratio (ref 0.0–4.4)
CHOLESTEROL TOTAL: 193 mg/dL (ref 100–199)
HDL: 62 mg/dL (ref >39)
LDL CALC: 116 mg/dL — AB (ref 0–99)
TRIGLYCERIDES: 76 mg/dL (ref 0–149)
VLDL CHOLESTEROL CAL: 15 mg/dL (ref 5–40)

## 2015-05-10 LAB — VITAMIN D 25 HYDROXY (VIT D DEFICIENCY, FRACTURES): Vit D, 25-Hydroxy: 33.4 ng/mL (ref 30.0–100.0)

## 2015-07-25 ENCOUNTER — Encounter (HOSPITAL_COMMUNITY): Payer: Self-pay

## 2015-07-25 ENCOUNTER — Emergency Department (HOSPITAL_COMMUNITY)
Admission: EM | Admit: 2015-07-25 | Discharge: 2015-07-25 | Disposition: A | Discharge status: Home / Self Care | Payer: Medicaid Other | Attending: Emergency Medicine | Admitting: Emergency Medicine

## 2015-07-25 DIAGNOSIS — K0889 Other specified disorders of teeth and supporting structures: Secondary | ICD-10-CM | POA: Diagnosis not present

## 2015-07-25 DIAGNOSIS — Z87891 Personal history of nicotine dependence: Secondary | ICD-10-CM | POA: Diagnosis not present

## 2015-07-25 DIAGNOSIS — K029 Dental caries, unspecified: Secondary | ICD-10-CM | POA: Insufficient documentation

## 2015-07-25 DIAGNOSIS — Z79899 Other long term (current) drug therapy: Secondary | ICD-10-CM | POA: Insufficient documentation

## 2015-07-25 DIAGNOSIS — Z792 Long term (current) use of antibiotics: Secondary | ICD-10-CM | POA: Insufficient documentation

## 2015-07-25 DIAGNOSIS — Z8679 Personal history of other diseases of the circulatory system: Secondary | ICD-10-CM | POA: Diagnosis not present

## 2015-07-25 MED ORDER — OXYCODONE-ACETAMINOPHEN 5-325 MG PO TABS
1.0000 | ORAL_TABLET | Freq: Once | ORAL | Status: AC
  Administered 2015-07-25: 1 via ORAL
  Filled 2015-07-25: qty 1

## 2015-07-25 MED ORDER — KETOROLAC TROMETHAMINE 60 MG/2ML IM SOLN
60.0000 mg | Freq: Once | INTRAMUSCULAR | Status: AC
  Administered 2015-07-25: 60 mg via INTRAMUSCULAR
  Filled 2015-07-25: qty 2

## 2015-07-25 MED ORDER — CLINDAMYCIN HCL 300 MG PO CAPS: 300.0000 mg | ORAL_CAPSULE | Freq: Four times a day (QID) | ORAL | Status: DC

## 2015-07-25 MED ORDER — OXYCODONE-ACETAMINOPHEN 5-325 MG PO TABS: 1.0000 | ORAL_TABLET | ORAL | Status: DC | PRN

## 2015-07-25 NOTE — ED Notes (Signed)
Pt reports dental abscess to r upper jaw.  Was put on keflex 2 days ago but pt says is worse. No obvious facial swelling.

## 2015-07-28 NOTE — ED Provider Notes (Signed)
CSN: [id number]     Arrival date & time 07/25/15  1803 History   First MD Initiated Contact with Patient 07/25/15 1811     Chief Complaint  Patient presents with  . Dental Pain     (Consider location/radiation/quality/duration/timing/severity/associated sxs/prior Treatment) HPI   Janice Blanchard is a 49 y.o. female who presents to the Emergency Department complaining of dental pain for 3-4 days.  She states that she was seen by her dentist earlier this week and started on Keflex, but continues to have sharp throbbing pain to her right jaw and face.  She states that she has pain to her right upper back teeth with chewing and hot or cold foods or liquids.  She states that her dentist advised her to see an oral surgeon, but her appt is not until January.  She denies fever, chills, difficulty swallowing, neck pain, or facial swelling.     Past Medical History  Diagnosis Date  . PAC (premature atrial contraction)     symptomatic.  Holter monitor showed PACs.  Never took meds.  Spontaneously resolved.   Past Surgical History  Procedure Laterality Date  . Cesarean section      2003 and 1992   Family History  Problem Relation Age of Onset  . Alcohol abuse Father   . Deafness Daughter   . Hepatitis Daughter     autoimmune  . Alzheimer's disease Maternal Grandfather    Social History  Substance Use Topics  . Smoking status: Former Smoker    Quit date: 01/05/2009  . Smokeless tobacco: Never Used  . Alcohol Use: No   OB History    No data available     Review of Systems  Constitutional: Negative for fever and appetite change.  HENT: Positive for dental problem. Negative for congestion, facial swelling, sore throat and trouble swallowing.   Eyes: Negative for pain and visual disturbance.  Musculoskeletal: Negative for neck pain and neck stiffness.  Neurological: Negative for dizziness, facial asymmetry and headaches.  Hematological: Negative for adenopathy.  All other systems  reviewed and are negative.     Allergies  Review of patient's allergies indicates no known allergies.  Home Medications   Prior to Admission medications   Medication Sig Start Date End Date Taking? Authorizing Provider  clindamycin (CLEOCIN) 300 MG capsule Take 1 capsule (300 mg total) by mouth 4 (four) times daily. For 7 days 07/25/15   Yoali Conry, PA-C  Flaxseed, Linseed, (FLAX SEED OIL) 1000 MG CAPS Take 2,000 mg by mouth daily.    Historical Provider, MD  Omega-3 Fatty Acids (FISH OIL) 1000 MG CAPS Take 3,000 mg by mouth daily.    Historical Provider, MD  Omega-3 Krill Oil 1000 MG CAPS Take 2,000 mg by mouth daily.    Historical Provider, MD  oxyCODONE-acetaminophen (PERCOCET/ROXICET) 5-325 MG tablet Take 1 tablet by mouth every 4 (four) hours as needed. 07/25/15   Sharea Guinther, PA-C  penicillin v potassium (VEETID) 500 MG tablet Take 1 tablet (500 mg total) by mouth 4 (four) times daily. 05/09/15   Frederica Kuster, MD  simvastatin (ZOCOR) 20 MG tablet Take 1 tablet (20 mg total) by mouth at bedtime. Patient not taking: Reported on 05/09/2015 11/07/14   Junie Spencer, FNP  Vitamin D, Ergocalciferol, (DRISDOL) 50000 UNITS CAPS capsule Take 1 capsule (50,000 Units total) by mouth every 7 (seven) days. 11/07/14   Christy A Hawks, FNP   BP 146/87 mmHg  Pulse 106  Temp(Src) 97.5 F (36.4  C) (Oral)  Resp 20  Ht 5\' 4"  (1.626 m)  Wt 175 lb (79.379 kg)  BMI 30.02 kg/m2  SpO2 100% Physical Exam  Constitutional: She is oriented to person, place, and time. She appears well-developed and well-nourished. No distress.  HENT:  Head: Normocephalic and atraumatic.  Right Ear: Tympanic membrane and ear canal normal.  Left Ear: Tympanic membrane and ear canal normal.  Mouth/Throat: Uvula is midline, oropharynx is clear and moist and mucous membranes are normal. No trismus in the jaw. Dental caries present. No dental abscesses or uvula swelling.  Dental caries of the right upper second and  third molars.  No facial swelling, obvious dental abscess, trismus, or sublingual abnml.    Neck: Normal range of motion. Neck supple.  Cardiovascular: Normal rate, regular rhythm and normal heart sounds.   No murmur heard. Pulmonary/Chest: Effort normal. No respiratory distress.  Musculoskeletal: Normal range of motion.  Lymphadenopathy:    She has no cervical adenopathy.  Neurological: She is alert and oriented to person, place, and time. She exhibits normal muscle tone. Coordination normal.  Skin: Skin is warm and dry.  Nursing note and vitals reviewed.   ED Course  Procedures (including critical care time)   MDM   Final diagnoses:  Pain, dental    Pt is well appearing.  Vitals stable.  No facial swelling or obvious dental abscess.  Airway patent.  No concerning sx's for Ludwig's angina.  Pt agrees to close dental f/u.  rx for pain medication and clindamycin.    Pauline Ausammy Loy Mccartt, PA-C 07/28/15 [id number]  Bethann BerkshireJoseph Zammit, MD 07/28/15 [phone]

## 2016-03-31 ENCOUNTER — Telehealth: Payer: Self-pay | Admitting: Family

## 2016-04-09 IMAGING — CT CT ABD-PELV W/ CM
2 of 4 series · 16 of 46 positions shown, 18 images · IV contrast (omnipaque)
Comparison: None.

CLINICAL DATA: Low abdomen pain with rectal bleeding and
constipation.

EXAM:
CT ABDOMEN AND PELVIS WITH CONTRAST
TECHNIQUE: Multidetector CT imaging of the abdomen and pelvis was performed
using the standard protocol following bolus administration of
intravenous contrast.
CONTRAST:  50mL OMNIPAQUE IOHEXOL 300 MG/ML SOLN, 100mL OMNIPAQUE
IOHEXOL 300 MG/ML SOLN

[Series 2: abd_pel_with 5.0 b40f · axial · 0.78mm/px · z∈[-484,-64]mm · 13 of 94 slices shown, 15 images]
[im 5/94  soft-tissue]
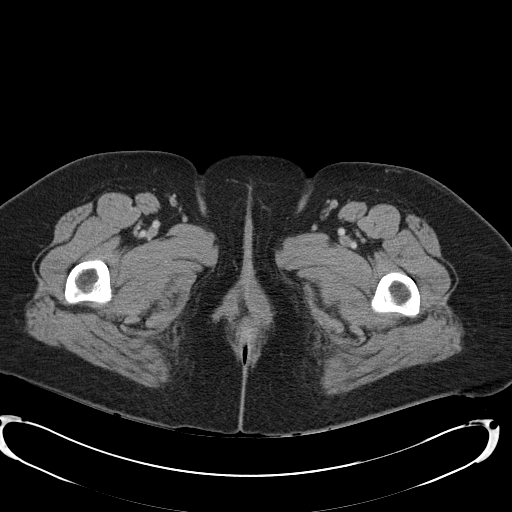
[im 5/94  bone]
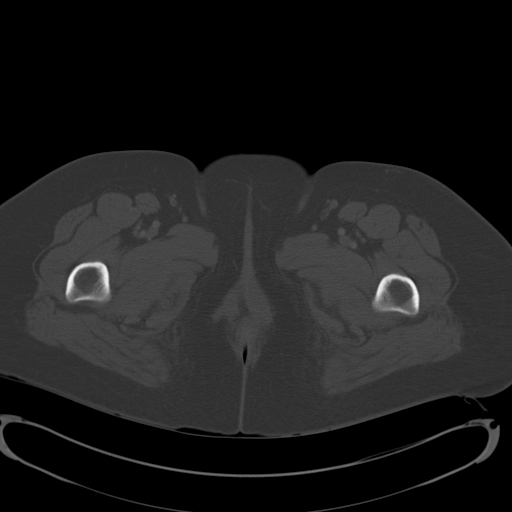
[im 14/94  soft-tissue]
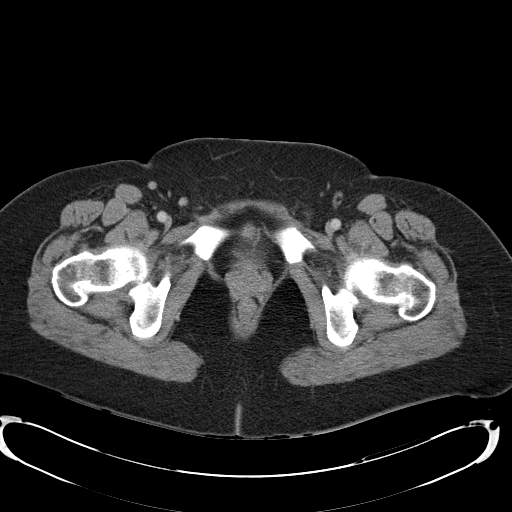
[im 18/94  soft-tissue]
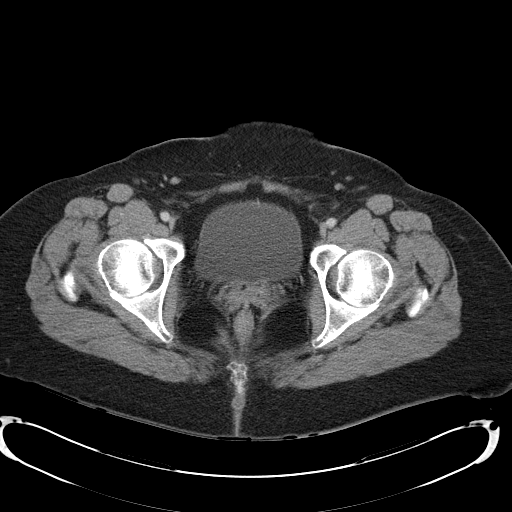
[im 27/94  soft-tissue]
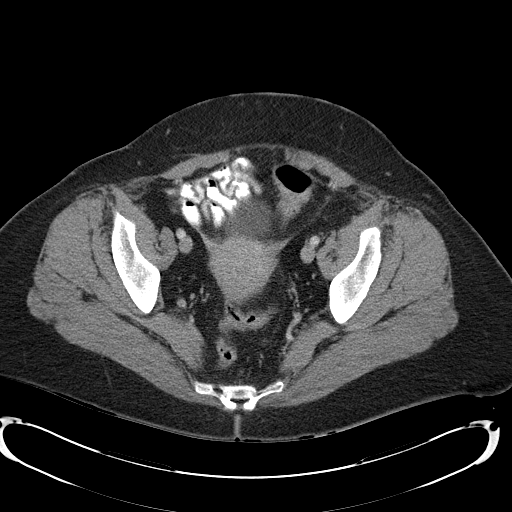
[im 32/94  soft-tissue]
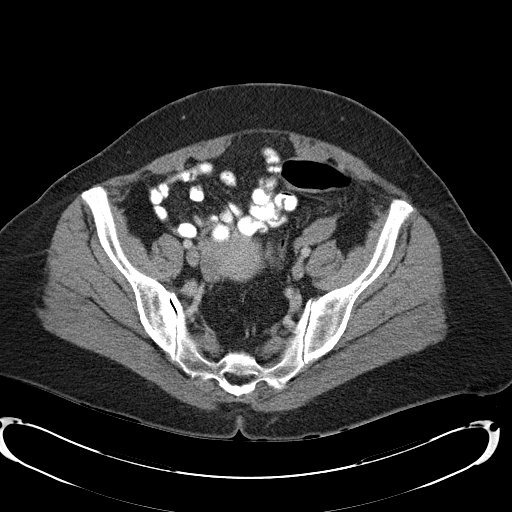
[im 40/94  soft-tissue]
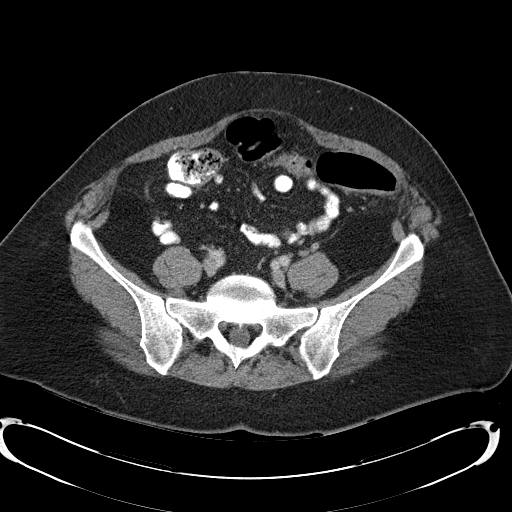
[im 49/94  soft-tissue]
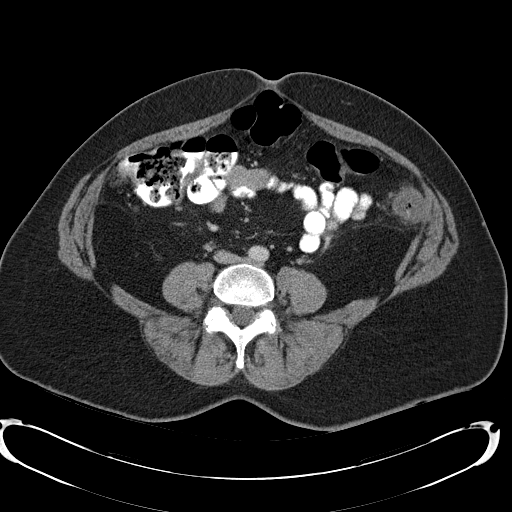
[im 54/94  soft-tissue]
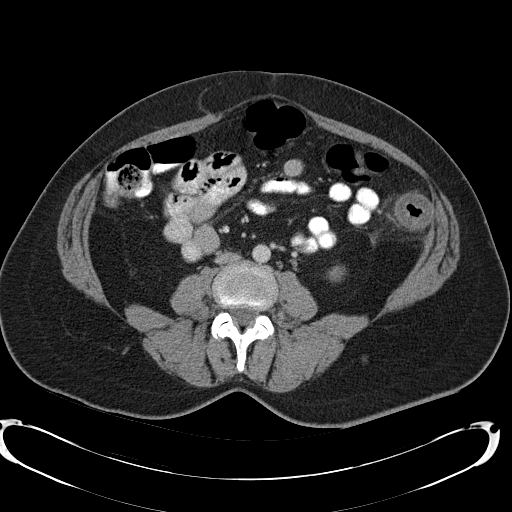
[im 63/94  soft-tissue]
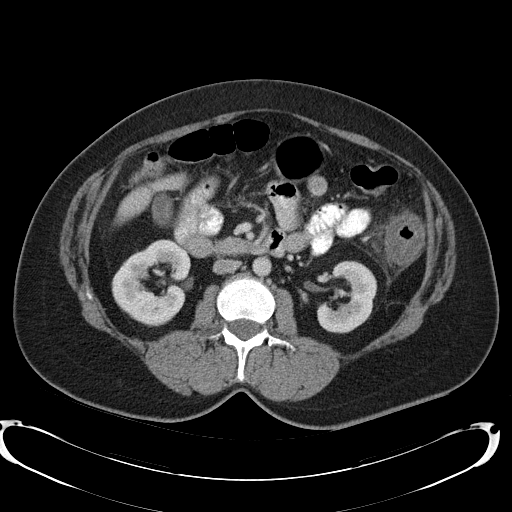
[im 63/94  bone]
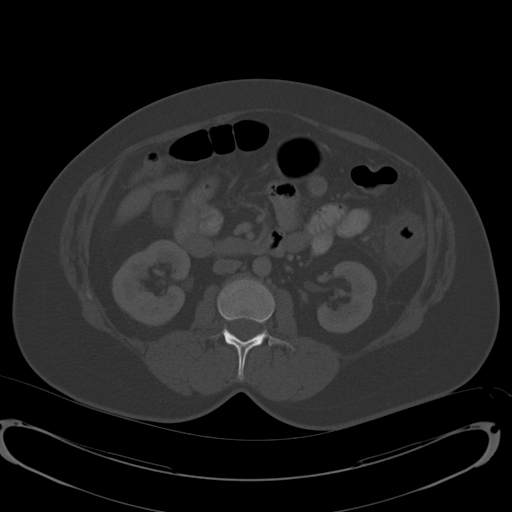
[im 67/94  soft-tissue]
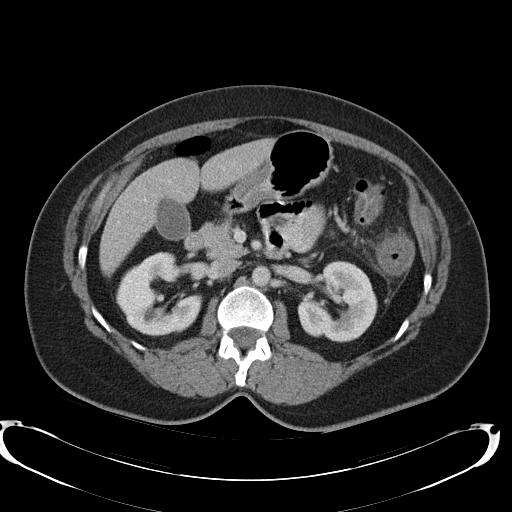
[im 76/94  soft-tissue]
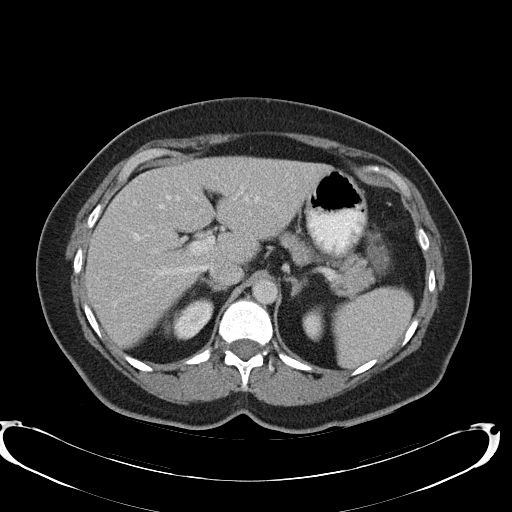
[im 80/94  soft-tissue]
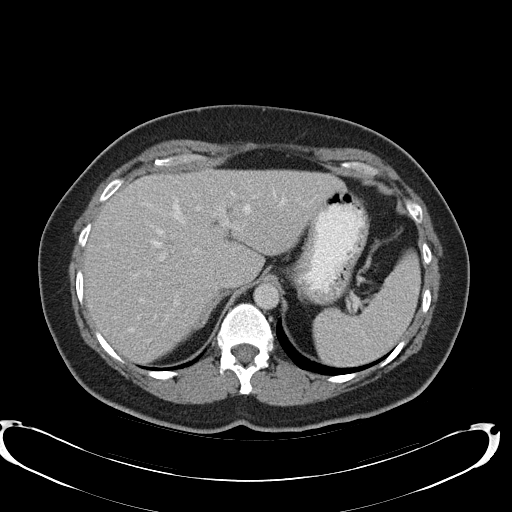
[im 89/94  soft-tissue]
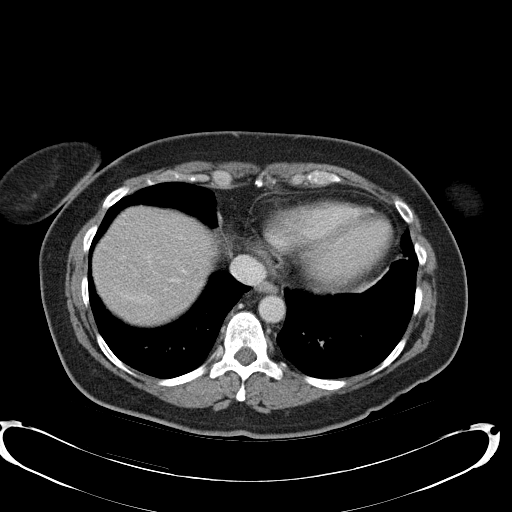

[Series 4: abd_pel_with 3.0 spo · coronal · 0.69mm/px · 3 of 89 slices shown]
[im 30/89  soft-tissue]
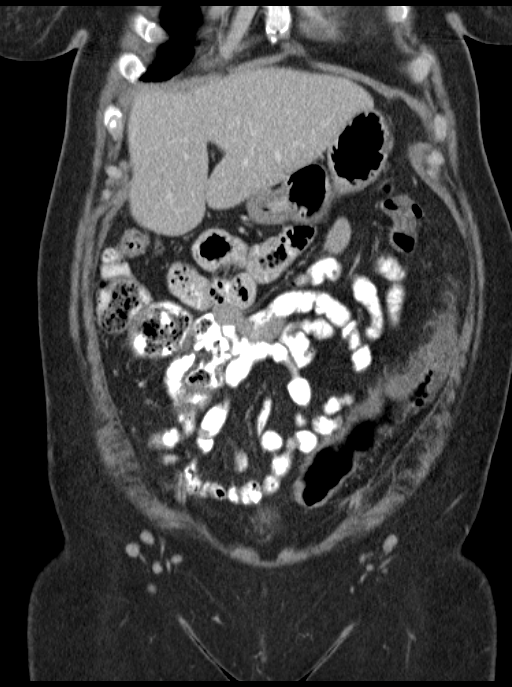
[im 40/89  soft-tissue]
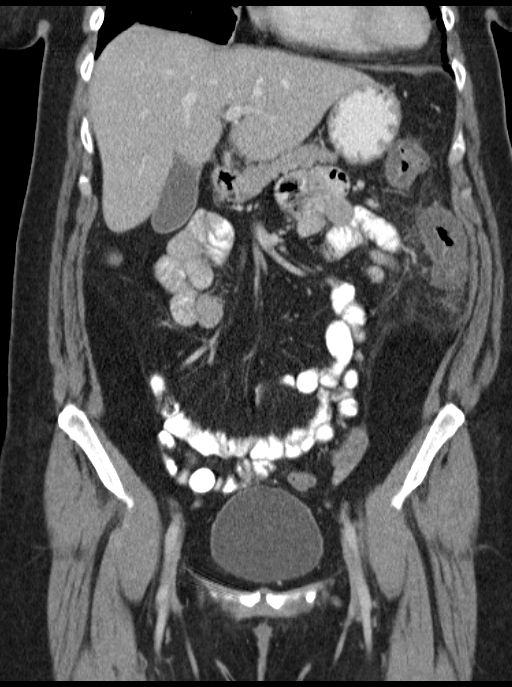
[im 49/89  soft-tissue]
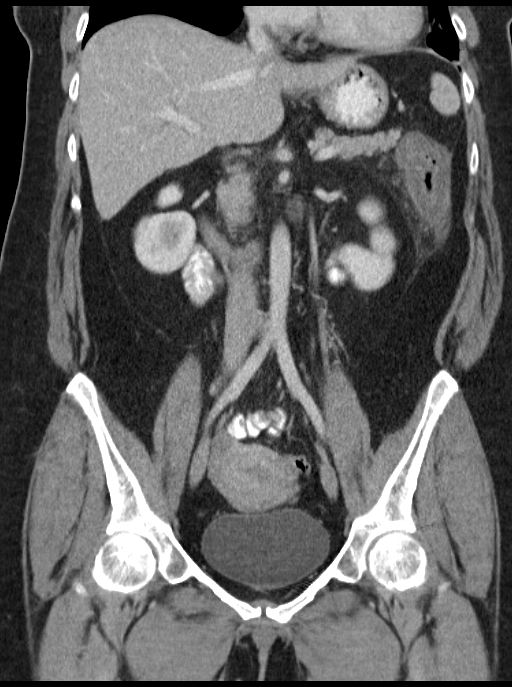

[16 of 46 positions shown; findings below may reference images not displayed]

FINDINGS: There is long segment circumferential colonic wall thickening with
surrounding inflammation starting at the left transverse colon near
the splenic flexure extending to the distal descending colon
measuring at least 18 cm in length. The remainder of the colon is
normal. The small bowel is normal. The appendix is not seen but no
inflammation is noted around the cecum.

There are small low-density lesions within the liver the larger
measures 6.5 mm in the injury segment right lobe liver. Views due
small to characterize. The spleen, pancreas, gallbladder, adrenal
glands are normal. There are small cysts within the right kidney.
There is no hydronephrosis bilaterally. The left kidney is normal.
The aorta is normal. Enhancement of the origins of the superior
mesenteric artery, celiac artery, and inferior mesenteric artery are
identified. There is no abdominal lymphadenopathy.

Fluid-filled bladder is normal. The uterus is normal. There is mild
atelectasis or scar of the anterior lung bases. No acute abnormality
is identified within the visualized bones.
IMPRESSION: Long segment circumferential colonic wall thickening with
surrounding inflammation starting at the left transverse colon near
the splenic flexure extending to the distal descending colon
measuring at least 18 cm in length. The remainder of the colon is
normal. The findings are more likely due to infectious or
inflammatory etiology, and less likely neoplastic etiology given the
long segment involvement. Follow-up CT scan after treatment is
recommended to ensure complete resolution.

## 2017-06-17 ENCOUNTER — Ambulatory Visit (INDEPENDENT_AMBULATORY_CARE_PROVIDER_SITE_OTHER): Payer: Medicaid Other | Admitting: Nurse Practitioner

## 2017-06-17 ENCOUNTER — Encounter: Payer: Self-pay | Admitting: Nurse Practitioner

## 2017-06-17 VITALS — BP 126/83 | HR 111 | Temp 97.9°F | Ht 64.0 in | Wt 172.0 lb

## 2017-06-17 DIAGNOSIS — L03114 Cellulitis of left upper limb: Secondary | ICD-10-CM | POA: Diagnosis not present

## 2017-06-17 MED ORDER — SULFAMETHOXAZOLE-TRIMETHOPRIM 800-160 MG PO TABS: 1.0000 | ORAL_TABLET | Freq: Two times a day (BID) | ORAL | 0 refills | Status: DC

## 2017-06-17 NOTE — Progress Notes (Signed)
   Subjective:    Patient ID: Janice Blanchard, female    DOB: Feb 25, 1966, 51 y.o.   MRN: [id number]  HPI Patient comes in today accompanied by her husband. He is c/o a place on her left arm. Seen it last night around 10pm. Has gotten bigger and is painful. Achy all over.     Review of Systems  Constitutional: Positive for chills.  HENT: Negative.   Respiratory: Negative.   Cardiovascular: Negative.   Gastrointestinal: Positive for nausea.  Genitourinary: Negative.   Neurological: Negative.   Psychiatric/Behavioral: Negative.   All other systems reviewed and are negative.      Objective:   Physical Exam  Constitutional: She appears well-developed and well-nourished. No distress.  Cardiovascular: Normal rate and regular rhythm.   Pulmonary/Chest: Effort normal and breath sounds normal.  Neurological: She is alert.  Skin:  10cm annular macuclar lesion just below axillary area. Hot to touch   BP 126/83   Pulse (!) 111   Temp 97.9 F (36.6 C) (Oral)   Ht  (1.626 m)   Wt 172 lb (78 kg)   BMI 29.52 kg/m         Assessment & Plan:   1. Cellulitis of left upper extremity    Cool compresses Draw circle- if reddness worsens throughout night need to RTOO for rocephin shot Meds ordered this encounter  Medications  . sulfamethoxazole-trimethoprim (BACTRIM DS) 800-160 MG tablet    Sig: Take 1 tablet by mouth 2 (two) times daily.    Dispense:  14 tablet    Refill:  0    Order Specific Question:   Supervising Provider    Answer:   Johna Sheriff [[id number]]   Mary-Margaret Daphine Deutscher, FNP

## 2017-06-17 NOTE — Patient Instructions (Signed)

## 2017-06-29 ENCOUNTER — Ambulatory Visit (INDEPENDENT_AMBULATORY_CARE_PROVIDER_SITE_OTHER): Payer: Medicaid Other | Admitting: Nurse Practitioner

## 2017-06-29 ENCOUNTER — Encounter: Payer: Self-pay | Admitting: Nurse Practitioner

## 2017-06-29 VITALS — BP 108/66 | HR 101 | Temp 99.4°F | Ht 64.0 in | Wt 170.0 lb

## 2017-06-29 DIAGNOSIS — L03114 Cellulitis of left upper limb: Secondary | ICD-10-CM

## 2017-06-29 MED ORDER — CEFTRIAXONE SODIUM 1 G IJ SOLR
1.0000 g | Freq: Once | INTRAMUSCULAR | Status: AC
  Administered 2017-06-29: 1 g via INTRAMUSCULAR

## 2017-06-29 MED ORDER — DOXYCYCLINE HYCLATE 100 MG PO TABS: 100.0000 mg | ORAL_TABLET | Freq: Two times a day (BID) | ORAL | 0 refills | Status: DC

## 2017-06-29 NOTE — Progress Notes (Signed)
   Subjective:    Patient ID: Janice Blanchard, female    DOB: Sep 16, 1965, 51 y.o.   MRN: [id number]  HPI  Patient was seen on 06/17/17 with cellulitis of left upper arm that we are thinking came from a bug bite. She was given bactrim and it got better. Same are started to get red and hot again yesterday. redness has spread out annularly. Tender to touch.   Review of Systems  Constitutional: Negative for chills and fever.  HENT: Negative.   Respiratory: Negative.   Cardiovascular: Negative.   Musculoskeletal: Positive for arthralgias and myalgias.  Skin: Positive for wound (left upper arm).  Neurological: Negative.   Psychiatric/Behavioral: Negative.   All other systems reviewed and are negative.      Objective:   Physical Exam  Constitutional: She is oriented to person, place, and time. She appears well-developed and well-nourished. No distress.  Cardiovascular: Normal rate and regular rhythm.   Pulmonary/Chest: Effort normal and breath sounds normal.  Neurological: She is alert and oriented to person, place, and time.  Skin: Skin is warm.  5cm indurated erythematous annular lesion left upper inner arm with redness distal to about 12 cm in size annularly.  Psychiatric: She has a normal mood and affect. Her behavior is normal. Judgment and thought content normal.   BP 108/66   Pulse (!) 101   Temp 99.4 F (37.4 C) (Oral)   Ht 5\' 4"  (1.626 m)   Wt 170 lb (77.1 kg)   BMI 29.18 kg/m      Assessment & Plan:   1. Cellulitis of left upper extremity    Meds ordered this encounter  Medications  . cefTRIAXone (ROCEPHIN) injection 1 g    Order Specific Question:   Antibiotic Indication:    Answer:   Cellulitis  . doxycycline (VIBRA-TABS) 100 MG tablet    Sig: Take 1 tablet (100 mg total) by mouth 2 (two) times daily. 1 po bid    Dispense:  20 tablet    Refill:  0    Order Specific Question:   Supervising Provider    Answer:   Johna SheriffVINCENT, CAROL L [[id number]]   Cool  compresses Watch for increasing redness rto if not improving or worsens  Mary-Margaret Daphine DeutscherMartin, FNP

## 2017-06-29 NOTE — Patient Instructions (Signed)

## 2017-12-07 ENCOUNTER — Ambulatory Visit: Payer: Medicaid Other | Admitting: Family Medicine

## 2017-12-07 ENCOUNTER — Encounter: Payer: Self-pay | Admitting: Family Medicine

## 2017-12-07 VITALS — BP 131/84 | HR 91 | Temp 98.6°F | Ht 64.0 in | Wt 174.0 lb

## 2017-12-07 DIAGNOSIS — N3000 Acute cystitis without hematuria: Secondary | ICD-10-CM

## 2017-12-07 DIAGNOSIS — L209 Atopic dermatitis, unspecified: Secondary | ICD-10-CM

## 2017-12-07 LAB — URINALYSIS, COMPLETE
Bilirubin, UA: NEGATIVE
Glucose, UA: NEGATIVE
NITRITE UA: NEGATIVE
Specific Gravity, UA: >1.03 — ABNORMAL HIGH (ref 1.005–1.030)
Urobilinogen, Ur: 1 mg/dL (ref 0.2–1.0)
pH, UA: 5.5 (ref 5.0–7.5)

## 2017-12-07 LAB — MICROSCOPIC EXAMINATION
RBC, UA: >30 /hpf — AB (ref 0–2)
RENAL EPITHEL UA: NONE SEEN /HPF
WBC, UA: >30 /hpf — AB (ref 0–5)

## 2017-12-07 MED ORDER — CEFTRIAXONE SODIUM 1 G IJ SOLR
1.0000 g | Freq: Once | INTRAMUSCULAR | Status: AC
  Administered 2017-12-07: 1 g via INTRAMUSCULAR

## 2017-12-07 MED ORDER — TRIAMCINOLONE ACETONIDE 0.1 % EX CREA
1.0000 | TOPICAL_CREAM | Freq: Two times a day (BID) | CUTANEOUS | 0 refills | Status: DC
Start: 2017-12-07 — End: 2018-10-19

## 2017-12-07 NOTE — Progress Notes (Signed)
BP 131/84   Pulse 91   Temp 98.6 F (37 C) (Oral)   Ht 5\' 4"  (1.626 m)   Wt 174 lb (78.9 kg)   BMI 29.87 kg/m    Subjective:    Patient ID: Janice Blanchard, female    DOB: 04-07-1966, 52 y.o.   MRN: [id number]  HPI: Janice Blanchard is a 52 y.o. female presenting on 12/07/2017 for Urinary Tract Infection (dysuria, abdominal pressure, frequency; since yesterday) and Rash (on arms, itchy, has been working in woods)   HPI Dysuria Patient comes in complaining of dysuria and suprapubic abdominal discomfort that is been going on since yesterday.  She did take some Azo this morning.  She denies any fevers or chills or flank Blanchard.  She has been feeling the burning and urinary frequency since yesterday but the Azo did help this morning to reduce the pressure.  She has had similar UTIs in the past but not frequently.   Rash Patient has scattered spots of itchy rash on both of her upper arms.  She says she was out in the woods yesterday and thinks it may have been correlated to that.  She denies any drainage or redness or warmth but just a lot of pruritus and then she will scratch them and they will scab over again.  She says this is been going on for about 5 days now and it does not seem to be spreading anywhere but just has the same spots that will not go away because they are so itchy.  Relevant past medical, surgical, family and social history reviewed and updated as indicated. Interim medical history since our last visit reviewed. Allergies and medications reviewed and updated.  Review of Systems  Constitutional: Negative for chills and fever.  Eyes: Negative for visual disturbance.  Respiratory: Negative for chest tightness and shortness of breath.   Cardiovascular: Negative for chest Blanchard and leg swelling.  Gastrointestinal: Positive for abdominal Blanchard.  Genitourinary: Positive for dysuria and frequency. Negative for difficulty urinating, flank Blanchard, hematuria, vaginal bleeding, vaginal  discharge and vaginal Blanchard.  Musculoskeletal: Negative for back Blanchard and gait problem.  Skin: Positive for rash.  Neurological: Negative for light-headedness and headaches.  Psychiatric/Behavioral: Negative for agitation and behavioral problems.  All other systems reviewed and are negative.   Per HPI unless specifically indicated above   Allergies as of 12/07/2017      Reactions   Eggs Or Egg-derived Products       Medication List        Accurate as of 12/07/17 11:54 AM. Always use your most recent med list.          triamcinolone cream 0.1 % Commonly known as:  KENALOG Apply 1 application topically 2 (two) times daily.   Vitamin D (Ergocalciferol) 50000 units Caps capsule Commonly known as:  DRISDOL Take 1 capsule (50,000 Units total) by mouth every 7 (seven) days.          Objective:    BP 131/84   Pulse 91   Temp 98.6 F (37 C) (Oral)   Ht 5\' 4"  (1.626 m)   Wt 174 lb (78.9 kg)   BMI 29.87 kg/m   Wt Readings from Last 3 Encounters:  12/07/17 174 lb (78.9 kg)  06/29/17 170 lb (77.1 kg)  06/17/17 172 lb (78 kg)    Physical Exam  Constitutional: She is oriented to person, place, and time. She appears well-developed and well-nourished. No distress.  Eyes: Conjunctivae are normal.  Cardiovascular: Normal rate, regular rhythm, normal heart sounds and intact distal pulses.  No murmur heard. Pulmonary/Chest: Effort normal and breath sounds normal. No respiratory distress. She has no wheezes. She has no rales.  Abdominal: Soft. She exhibits no distension. There is tenderness (Mild suprapubic abdominal discomfort, no CVA tenderness). There is no rebound and no guarding.  Musculoskeletal: Normal range of motion.  Neurological: She is alert and oriented to person, place, and time. Coordination normal.  Skin: Skin is warm and dry. Rash (Few scattered papules on both upper extremities, no erythema or induration or drainage from any of the sites) noted. She is not  diaphoretic.  Psychiatric: She has a normal mood and affect. Her behavior is normal.  Nursing note and vitals reviewed.   Urinalysis: Greater than 30 WBCs, greater than 30 RBCs, 0-10 epithelial cells, moderate bacteria, 3+ blood, 1+ leukocytes, trace ketones    Assessment & Plan:   Problem List Items Addressed This Visit    None    Visit Diagnoses    Acute cystitis without hematuria    -  Primary   Relevant Medications   cefTRIAXone (ROCEPHIN) injection 1 g (Start on 12/07/2017 12:00 PM)   Other Relevant Orders   Urinalysis, Complete   Atopic dermatitis, unspecified type       Relevant Medications   triamcinolone cream (KENALOG) 0.1 %       Follow up plan: Return if symptoms worsen or fail to improve.  Counseling provided for all of the vaccine components Orders Placed This Encounter  Procedures  . Urinalysis, Complete    Arville CareJoshua Dettinger, MD [facility] 12/07/2017, 11:54 AM

## 2018-10-06 ENCOUNTER — Ambulatory Visit: Payer: Self-pay | Admitting: Family Medicine

## 2018-10-19 ENCOUNTER — Ambulatory Visit (INDEPENDENT_AMBULATORY_CARE_PROVIDER_SITE_OTHER): Payer: PRIVATE HEALTH INSURANCE | Admitting: Family Medicine

## 2018-10-19 ENCOUNTER — Encounter: Payer: Self-pay | Admitting: Family Medicine

## 2018-10-19 VITALS — BP 114/77 | HR 82 | Temp 97.9°F | Resp 16 | Ht 64.5 in | Wt 180.0 lb

## 2018-10-19 DIAGNOSIS — E559 Vitamin D deficiency, unspecified: Secondary | ICD-10-CM

## 2018-10-19 DIAGNOSIS — E669 Obesity, unspecified: Secondary | ICD-10-CM

## 2018-10-19 DIAGNOSIS — Z0000 Encounter for general adult medical examination without abnormal findings: Secondary | ICD-10-CM | POA: Diagnosis not present

## 2018-10-19 NOTE — Patient Instructions (Signed)

## 2018-10-19 NOTE — Progress Notes (Signed)
Office Note 10/19/2018  CC:  Chief Complaint  Patient presents with  . Establish Care    Previous PCP: [facility]    HPI:  [name] is a 53 y.o. White female who is here accompanied by her daughter to establish care [facility]  I did see her 2 times back in 2015 and then she transferred care. Old records EPIC/[facility] EMR were reviewed prior to or during today's visit.  She has never had a mammogram or colonoscopy. Last pap was approx 16 yrs ago. She does not care to get any well woman care. Also does not want colon cancer screening.  She is a smoker: approx 30-35 pack-yr hx. Current. No alcohol.  No drugs.  She is interested in CPE today as well as vit D level and cholesterol. Says she has been dx'd with vit D def in the past, as well as "some cholesterol issues".  Past Medical History:  Diagnosis Date  . PAC (premature atrial contraction)    symptomatic.  Holter monitor showed PACs.  Never took meds.  Spontaneously resolved.  . Vitamin D deficiency     Past Surgical History:  Procedure Laterality Date  . CESAREAN SECTION     2003 and 1992    Family History  Problem Relation Age of Onset  . Alcohol abuse Father   . Cancer Father   . Deafness Daughter   . Hepatitis Daughter        autoimmune  . Alzheimer's disease Maternal Grandfather   . Alzheimer's disease Mother   . Diabetes Paternal Grandmother   . Diabetes Paternal Grandfather   . Cancer Paternal Grandfather     Social History   Socioeconomic History  . Marital status: Married    Spouse name: Not on file  . Number of children: Not on file  . Years of education: Not on file  . Highest education level: Not on file  Occupational History  . Not on file  Social Needs  . Financial resource strain: Not on file  . Food insecurity:    Worry: Not on file    Inability: Not on file  . Transportation needs:    Medical: Not on file    Non-medical: Not on file  Tobacco Use  .  Smoking status: Current Every Day Smoker    Packs/day: 1.50    Years: 20.00    Pack years: 30.00    Types: Cigarettes    Last attempt to quit: 01/05/2009    Years since quitting: 9.7  . Smokeless tobacco: Never Used  . Tobacco comment: Quit on 01/05/09 x [id number] then started back  Substance and Sexual Activity  . Alcohol use: No  . Drug use: No  . Sexual activity: Not on file  Lifestyle  . Physical activity:    Days per week: Not on file    Minutes per session: Not on file  . Stress: Not on file  Relationships  . Social connections:    Talks on phone: Not on file    Gets together: Not on file    Attends religious service: Not on file    Active member of club or organization: Not on file    Attends meetings of clubs or organizations: Not on file    Relationship status: Not on file  . Intimate partner violence:    Fear of current or ex partner: Not on file    Emotionally abused: Not on file    Physically abused: Not  on file    Forced sexual activity: Not on file  Other Topics Concern  . Not on file  Social History Narrative   Married, 5 children.   Homemaker.   Orig from [place].   Currently living in [place], [place].   Tob: 35 pack-yr hx, current as of 10/2018.   No alcohol or drugs.   Exercise: no formal exercise but not sedentary.   No meds currently. Outpatient Encounter Medications as of 10/19/2018  Medication Sig  . VITAMIN D PO Take 1 capsule by mouth daily.  . [DISCONTINUED] triamcinolone cream (KENALOG) 0.1 % Apply 1 application topically 2 (two) times daily. (Patient not taking: Reported on 10/19/2018)  . [DISCONTINUED] Vitamin D, Ergocalciferol, (DRISDOL) 50000 UNITS CAPS capsule Take 1 capsule (50,000 Units total) by mouth every 7 (seven) days. (Patient not taking: Reported on 10/19/2018)   No facility-administered encounter medications on file as of 10/19/2018.     Allergies  Allergen Reactions  . Eggs Or Egg-Derived Products     ROS Review of Systems   Constitutional: Negative for appetite change, chills, fatigue and fever.  HENT: Negative for congestion, dental problem, ear pain and sore throat.   Eyes: Negative for discharge, redness and visual disturbance.  Respiratory: Negative for cough, chest tightness, shortness of breath and wheezing.   Cardiovascular: Negative for chest pain, palpitations and leg swelling.  Gastrointestinal: Negative for abdominal pain, blood in stool, diarrhea, nausea and vomiting.  Genitourinary: Negative for difficulty urinating, dysuria, flank pain, frequency, hematuria and urgency.  Musculoskeletal: Negative for arthralgias, back pain, joint swelling, myalgias and neck stiffness.  Skin: Negative for pallor and rash.  Neurological: Negative for dizziness, speech difficulty, weakness and headaches.  Hematological: Negative for adenopathy. Does not bruise/bleed easily.  Psychiatric/Behavioral: Negative for confusion and sleep disturbance. The patient is not nervous/anxious.     PE; Blood pressure 114/77, pulse 82, temperature 97.9 F (36.6 C), temperature source Oral, resp. rate 16, height 5' 4.5" (1.638 m), weight 180 lb (81.6 kg), SpO2 98 %. Body mass index is 30.42 kg/m. Pt's daughter in room at the time of exam. Gen: Alert, well appearing.  Patient is oriented to person, place, time, and situation. AFFECT: pleasant, lucid thought and speech. ENT: Ears: EACs clear, normal epithelium.  TMs with good light reflex and landmarks bilaterally.  Eyes: no injection, icteris, swelling, or exudate.  EOMI, PERRLA. Nose: no drainage or turbinate edema/swelling.  No injection or focal lesion.  Mouth: lips without lesion/swelling.  Oral mucosa pink and moist.  Dentition intact and without obvious caries or gingival swelling.  Oropharynx without erythema, exudate, or swelling.  Neck: supple/nontender.  No LAD, mass, or TM.  Carotid pulses 2+ bilaterally, without bruits. CV: RRR, no m/r/g.   LUNGS: CTA bilat, nonlabored  resps, good aeration in all lung fields. ABD: soft, NT, ND, BS normal.  No hepatospenomegaly or mass.  No bruits. EXT: no clubbing, cyanosis, or edema.  Musculoskeletal: no joint swelling, erythema, warmth, or tenderness.  ROM of all joints intact. Skin - no sores or suspicious lesions or rashes or color changes  Pertinent labs:  Lab Results  Component Value Date   TSH 2.460 11/06/2014   Lab Results  Component Value Date   WBC 8.5 01/26/2014   HGB 12.3 01/26/2014   HCT 37.0 01/26/2014   MCV 98.7 01/26/2014   PLT 234 01/26/2014   Lab Results  Component Value Date   CREATININE 0.69 11/06/2014   BUN 13 11/06/2014   NA 140 11/06/2014  K 4.3 11/06/2014   CL 104 11/06/2014   CO2 21 11/06/2014   Lab Results  Component Value Date   ALT 29 11/06/2014   AST 20 11/06/2014   ALKPHOS 66 11/06/2014   BILITOT 0.5 11/06/2014   Lab Results  Component Value Date   CHOL 193 05/09/2015   Lab Results  Component Value Date   HDL 62 05/09/2015   Lab Results  Component Value Date   LDLCALC 116 (H) 05/09/2015   Lab Results  Component Value Date   TRIG 76 05/09/2015   Lab Results  Component Value Date   CHOLHDL 3.1 05/09/2015    ASSESSMENT AND PLAN:   New pt, establishing care.  Health maintenance exam: Reviewed age and gender appropriate health maintenance issues (prudent diet, regular exercise, health risks of tobacco and excessive alcohol, use of seatbelts, fire alarms in home, use of sunscreen).  Also reviewed age and gender appropriate health screening as well as vaccine recommendations. Vaccines: pt declines flu vaccine.  Tdap UTD.   Labs: fasting HP + vit D level--she'll return to get these when fasting. Cervical ca screening: pt declines. Breast ca screening: pt declines Colon ca screening: pt declines.   An After Visit Summary was printed and given to the patient.  Return for as needed.  Signed:  Santiago BumpersPhil Keatyn Jawad, MD           10/19/2018

## 2018-11-02 ENCOUNTER — Other Ambulatory Visit (INDEPENDENT_AMBULATORY_CARE_PROVIDER_SITE_OTHER): Payer: PRIVATE HEALTH INSURANCE

## 2018-11-02 DIAGNOSIS — Z0000 Encounter for general adult medical examination without abnormal findings: Secondary | ICD-10-CM

## 2018-11-02 DIAGNOSIS — E669 Obesity, unspecified: Secondary | ICD-10-CM

## 2018-11-02 DIAGNOSIS — E559 Vitamin D deficiency, unspecified: Secondary | ICD-10-CM | POA: Diagnosis not present

## 2018-11-02 LAB — CBC WITH DIFFERENTIAL/PLATELET
BASOS ABS: 0.1 10*3/uL (ref 0.0–0.1)
Basophils Relative: 1.3 % (ref 0.0–3.0)
Eosinophils Absolute: 0.3 10*3/uL (ref 0.0–0.7)
Eosinophils Relative: 4.8 % (ref 0.0–5.0)
HCT: 42.8 % (ref 36.0–46.0)
Hemoglobin: 14.6 g/dL (ref 12.0–15.0)
Lymphocytes Relative: 35.6 % (ref 12.0–46.0)
Lymphs Abs: 2.1 10*3/uL (ref 0.7–4.0)
MCHC: 34.1 g/dL (ref 30.0–36.0)
MCV: 99.5 fl (ref 78.0–100.0)
Monocytes Absolute: 0.4 10*3/uL (ref 0.1–1.0)
Monocytes Relative: 6.2 % (ref 3.0–12.0)
NEUTROS ABS: 3.1 10*3/uL (ref 1.4–7.7)
Neutrophils Relative %: 52.1 % (ref 43.0–77.0)
PLATELETS: 316 10*3/uL (ref 150.0–400.0)
RBC: 4.3 Mil/uL (ref 3.87–5.11)
RDW: 13.3 % (ref 11.5–15.5)
WBC: 6 10*3/uL (ref 4.0–10.5)

## 2018-11-02 LAB — COMPREHENSIVE METABOLIC PANEL
ALK PHOS: 63 U/L (ref 39–117)
ALT: 17 U/L (ref 0–35)
AST: 18 U/L (ref 0–37)
Albumin: 4.5 g/dL (ref 3.5–5.2)
BUN: 12 mg/dL (ref 6–23)
CO2: 29 mEq/L (ref 19–32)
Calcium: 9.6 mg/dL (ref 8.4–10.5)
Chloride: 105 mEq/L (ref 96–112)
Creatinine, Ser: 0.71 mg/dL (ref 0.40–1.20)
GFR: 85.73 mL/min (ref >60.00)
Glucose, Bld: 96 mg/dL (ref 70–99)
POTASSIUM: 4.5 meq/L (ref 3.5–5.1)
Sodium: 141 mEq/L (ref 135–145)
TOTAL PROTEIN: 6.9 g/dL (ref 6.0–8.3)
Total Bilirubin: 0.6 mg/dL (ref 0.2–1.2)

## 2018-11-02 LAB — LIPID PANEL
CHOLESTEROL: 196 mg/dL (ref 0–200)
HDL: 53.8 mg/dL (ref >39.00)
LDL Cholesterol: 128 mg/dL — ABNORMAL HIGH (ref 0–99)
NonHDL: 142.69
Total CHOL/HDL Ratio: 4
Triglycerides: 71 mg/dL (ref 0.0–149.0)
VLDL: 14.2 mg/dL (ref 0.0–40.0)

## 2018-11-03 LAB — TSH: TSH: 2.36 u[IU]/mL (ref 0.35–4.50)

## 2018-11-03 LAB — VITAMIN D 25 HYDROXY (VIT D DEFICIENCY, FRACTURES): VITD: 71.21 ng/mL (ref 30.00–100.00)

## 2020-04-19 ENCOUNTER — Telehealth: Payer: Self-pay

## 2020-04-19 ENCOUNTER — Encounter: Payer: Self-pay | Admitting: Family Medicine

## 2020-04-19 ENCOUNTER — Other Ambulatory Visit: Payer: Self-pay

## 2020-04-19 ENCOUNTER — Ambulatory Visit (INDEPENDENT_AMBULATORY_CARE_PROVIDER_SITE_OTHER): Payer: PRIVATE HEALTH INSURANCE | Admitting: Family Medicine

## 2020-04-19 VITALS — BP 119/80 | HR 98 | Temp 98.2°F | Resp 16 | Ht 64.5 in | Wt 184.6 lb

## 2020-04-19 DIAGNOSIS — R21 Rash and other nonspecific skin eruption: Secondary | ICD-10-CM

## 2020-04-19 DIAGNOSIS — L299 Pruritus, unspecified: Secondary | ICD-10-CM

## 2020-04-19 MED ORDER — PERMETHRIN 5 % EX CREA: TOPICAL_CREAM | CUTANEOUS | 0 refills | Status: DC

## 2020-04-19 NOTE — Telephone Encounter (Signed)
Patient called in. Everyone in household has red itchy bumps on their legs. She is requesting an appointment today. She can do a virtual visit with either provider or her daughter Janice Blanchard is willing to give up her appointment today at 1:30 so that her mother can be seen since her rash is the worse.

## 2020-04-19 NOTE — Telephone Encounter (Signed)
Agree. Signed:  Santiago Bumpers, MD           04/19/2020

## 2020-04-19 NOTE — Progress Notes (Signed)
OFFICE VISIT  04/19/2020   CC:  Chief Complaint  Patient presents with  . Rash    occurring 1 week ago, red itchy bumps on legs   HPI:    Patient is a 54 y.o. Caucasian female who presents for rash. Onset about a week ago, 1-2 "random" itchy red spots around knee cap areas, thought it was chiggers. Lots of people in her home have similar spots but some don't.  "Random" small bumps. Yesterday noted they increased and spread up legs and down LL's. Not much time spent outside.  No anti-itch med taken and no topical applied.    No fever, chills, or malaise. No one in family with signs of any systemic illness.   Past Medical History:  Diagnosis Date  . PAC (premature atrial contraction)    symptomatic.  Holter monitor showed PACs.  Never took meds.  Spontaneously resolved.  . Vitamin D deficiency     Past Surgical History:  Procedure Laterality Date  . CESAREAN SECTION     2003 and 1992    Outpatient Medications Prior to Visit  Medication Sig Dispense Refill  . VITAMIN D PO Take 1 capsule by mouth daily.     No facility-administered medications prior to visit.    Allergies  Allergen Reactions  . Eggs Or Egg-Derived Products     ROS As per HPI  PE: Blood pressure 119/80, pulse 98, temperature 98.2 F (36.8 C), temperature source Oral, resp. rate 16, height 5' 4.5" (1.638 m), weight 184 lb 9.6 oz (83.7 kg), SpO2 94 %. Gen: Alert, well appearing.  Patient is oriented to person, place, time, and situation. AFFECT: pleasant, lucid thought and speech. Both legs covered from thighs down to ankles with scattered light pink macules with central 1 mm vesicular lesion.  Minimal excoriations present.  No erythema.  Approx 50-60 lesions total.   No fissuring.  Nothing on feet/toes. Remainder of body (including hands, scalp, face, etc) are w/out any rash.  LABS:  none  IMPRESSION AND PLAN:  Pruritic rash: appearance suggestive of varicella zoster, but she has had chicken  pox illness in the past, says family members at home have all either had the vaccine or have had the chicken pox illness.  Low likelihood given this info, but will check varicella IgM and IgG today. Otherwise, will treat as scabies: elimite cream rx'd, instructions given. Scabies handout given so pt aware of what to do regarding home decontamination. Will treat the other family members who are patients of mine as well.  An After Visit Summary was printed and given to the patient.  FOLLOW UP: Return if symptoms worsen or fail to improve.  Signed:  Santiago Bumpers, MD           04/19/2020

## 2020-04-19 NOTE — Telephone Encounter (Signed)
SW patient and made the switch.

## 2020-04-19 NOTE — Telephone Encounter (Signed)
Ok for patient to switch appointments. Please assist mom with scheduling, thanks.

## 2020-04-19 NOTE — Patient Instructions (Signed)
Scabies, Adult  Scabies is a skin condition that happens when very small insects get under the skin (infestation). This causes a rash and severe itchiness. Scabies can spread from person to person (is contagious). If you get scabies, it is common for others in your household to get scabies too. With proper treatment, symptoms usually go away in 2-4 weeks. Scabies usually does not cause lasting problems. What are the causes? This condition is caused by tiny mites (Sarcoptes scabiei, or human itch mites) that can only be seen with a microscope. The mites get into the top layer of skin and lay eggs. Scabies can spread from person to person through:  Close contact with a person who has scabies.  Sharing or having contact with infested items, such as towels, bedding, or clothing. What increases the risk? The following factors may make you more likely to develop this condition:  Living in a nursing home or other extended care facility.  Having sexual contact with a partner who has scabies.  Caring for others who are at increased risk for scabies. What are the signs or symptoms? Symptoms of this condition include:  Severe itchiness. This is often worse at night.  A rash that includes tiny red bumps or blisters. The rash commonly occurs on the hands, wrists, elbows, armpits, chest, waist, groin, or buttocks. The bumps may form a line (burrow) in some areas.  Skin irritation. This can include scaly patches or sores. How is this diagnosed? This condition may be diagnosed based on:  A physical exam of the skin.  A skin test. Your health care provider may take a sample of your affected skin (skin scraping) and have it examined under a microscope for signs of mites. How is this treated? This condition may be treated with:  Medicated cream or lotion that kills the mites. This is spread on the entire body and left on for several hours. Usually, one treatment with medicated cream or lotion is  enough to kill all the mites. In severe cases, the treatment may need to be repeated.  Medicated cream that relieves itching.  Medicines taken by mouth (orally) that: ? Relieve itching. ? Reduce the swelling and redness. ? Kill the mites. This treatment may be done in severe cases. Follow these instructions at home: Medicines   Take or apply over-the-counter and prescription medicines as told by your health care provider.  Apply medicated cream or lotion as told by your health care provider.  Do not wash off the medicated cream or lotion until the necessary amount of time has passed. Skin care   Avoid scratching the affected areas of your skin.  Keep your fingernails closely trimmed to reduce injury from scratching.  Take cool baths or apply cool washcloths to your skin to help reduce itching. General instructions  Clean all items that you recently had contact with, including bedding, clothing, and furniture. Do this on the same day that you start treatment. ? Dry clean items, or use hot water to wash items. Dry items on the hot dry cycle. ? Place items that cannot be washed into closed, airtight plastic bags for at least 3 days. The mites cannot live for more than 3 days away from human skin. ? Vacuum furniture and mattresses that you use.  Make sure that other people who may have been infested are examined by a health care provider. These include members of your household and anyone who may have had contact with infested items.  Keep all follow-up   visits as told by your health care provider. This is important. Contact a health care provider if:  You have itching that does not go away after 4 weeks of treatment.  You continue to develop new bumps or burrows.  You have redness, swelling, or pain in your rash area after treatment.  You have fluid, blood, or pus coming from your rash. Summary  Scabies is a skin condition that causes a rash and severe itchiness.  This  condition is caused by tiny mites that get into the top layer of the skin and lay eggs.  Scabies can spread from person to person.  Follow treatments as recommended by your health care provider.  Clean all items that you recently had contact with. This information is not intended to replace advice given to you by your health care provider. Make sure you discuss any questions you have with your health care provider. Document Revised: 06/29/2018 Document Reviewed: 06/29/2018 Elsevier Patient Education  2020 [employer]  

## 2020-04-19 NOTE — Telephone Encounter (Signed)
Patient was seen today for rash and directed to use Elimite cream as prescribed. The same cream was also prescribed to other members of family that are also patients of yours. Carollee Herter Stowe(03/[birth year]/90), Dorinda Hill Stowe(05/15/88), Asher Muir Stowe(04/12/91), Charles Frasier(07/24/63), Sherron Ales Wherry(06/09/02), and Vinetta Bergamo Cotrell(06/09/02).

## 2020-04-22 LAB — VARICELLA ZOSTER ANTIBODY, IGG: Varicella IgG: 529.2 index

## 2020-04-22 LAB — VARICELLA ZOSTER ANTIBODY, IGM: Varicella Zoster Ab IgM: <=0.9 (ref <=0.90)

## 2020-05-31 ENCOUNTER — Encounter: Payer: Self-pay | Admitting: Family Medicine

## 2020-05-31 ENCOUNTER — Telehealth: Payer: PRIVATE HEALTH INSURANCE | Admitting: Family Medicine

## 2020-05-31 ENCOUNTER — Telehealth (INDEPENDENT_AMBULATORY_CARE_PROVIDER_SITE_OTHER): Payer: PRIVATE HEALTH INSURANCE | Admitting: Family Medicine

## 2020-05-31 VITALS — Ht 64.5 in | Wt 180.0 lb

## 2020-05-31 DIAGNOSIS — B029 Zoster without complications: Secondary | ICD-10-CM

## 2020-05-31 MED ORDER — VALACYCLOVIR HCL 1 G PO TABS: 1000.0000 mg | ORAL_TABLET | Freq: Three times a day (TID) | ORAL | 0 refills | Status: AC

## 2020-05-31 MED ORDER — PREDNISONE 20 MG PO TABS: ORAL_TABLET | ORAL | 0 refills | Status: DC

## 2020-05-31 NOTE — Progress Notes (Signed)
Virtual Visit via Video Note  I connected with pt on 05/31/20 at  4:00 PM EDT by a video enabled telemedicine application and verified that I am speaking with the correct person using two identifiers.  Location patient: home Location provider:work or home office Persons participating in the virtual visit: patient, provider  I discussed the limitations of evaluation and management by telemedicine and the availability of in person appointments. The patient expressed understanding and agreed to proceed.   HPI:  About 7 d/a felt onset of bad burning/tingling/jabbing pain in R temple, then a couple of days later she noted small blisters on red background coming up in the area of pain in R temple, extended into R temple hairline and a couple cm back on the scalp in that area.  Pain around front of tragus of ear but no lesion, a couple lesions on right side of forehead have come out today, very painful.  No lesions around eyelids and none on/in nose.  No lesions on cheeks, chin, or in mouth.   This is definitely unilateral. No fever, chills, or malaise.  No vision or hearing abnormality. She has been under immense emotional stress the last 6 mo: death of a daughter, illnesses in family, family relationship troubles.    ROS: See pertinent positives and negatives per HPI.  Past Medical History:  Diagnosis Date  . PAC (premature atrial contraction)    symptomatic.  Holter monitor showed PACs.  Never took meds.  Spontaneously resolved.  . Vitamin D deficiency     Past Surgical History:  Procedure Laterality Date  . CESAREAN SECTION     2003 and 1992     Current Outpatient Medications:  Marland Kitchen  VITAMIN D PO, Take 1 capsule by mouth daily., Disp: , Rfl:  .  permethrin (ELIMITE) 5 % cream, Apply from head to bottoms of feet.  Rinse off after 8-14 hours.  May repeat in 1 wk. (Patient not taking: Reported on 05/31/2020), Disp: 60 g, Rfl: 0  EXAM:  VITALS per patient if applicable:  Vitals with BMI  05/31/2020 04/19/2020 10/19/2018  Height 5' 4.5" 5' 4.5" 5' 4.5"  Weight 180 lbs 184 lbs 10 oz 180 lbs  BMI 30.43 31.21 30.43  Systolic - 119 114  Diastolic - 80 77  Pulse - 98 82     GENERAL: alert, oriented, appears well and in no acute distress  HEENT: atraumatic, conjunttiva clear, no obvious abnormalities on inspection of external nose and ears Face: grouped small vesicular lesions in R temple extending back into hairline and a couple cm onto scalp in same dermatome.  A couple of small new pinkish vesicles are noted on R side of forehead. The R temple area looks to have some older/ruptured/slightly crusting lesions.  No facial muscle asymmetry.  Eyes w/out injection or swelling or skin lesion.  Nose without swelling, erythema, or any skin lesion. Ears w/out swelling or lesion.  NECK: normal movements of the head and neck  LUNGS: on inspection no signs of respiratory distress, breathing rate appears normal, no obvious gross SOB, gasping or wheezing  CV: no obvious cyanosis  MS: moves all visible extremities without noticeable abnormality  PSYCH/NEURO: pleasant and cooperative, no obvious depression or anxiety, speech and thought processing grossly intact  LABS: none today   Chemistry      Component Value Date/Time   NA 141 11/02/2018 0957   NA 140 11/06/2014 0913   K 4.5 11/02/2018 0957   CL 105 11/02/2018 0957  CO2 29 11/02/2018 0957   BUN 12 11/02/2018 0957   BUN 13 11/06/2014 0913   CREATININE 0.71 11/02/2018 0957      Component Value Date/Time   CALCIUM 9.6 11/02/2018 0957   ALKPHOS 63 11/02/2018 0957   AST 18 11/02/2018 0957   ALT 17 11/02/2018 0957   BILITOT 0.6 11/02/2018 0957   BILITOT 0.5 11/06/2014 0913      ASSESSMENT AND PLAN:  Discussed the following assessment and plan:  1) Acute painful vesicular rash in right V1 distribution-->herpes zoster.   No lesions in the area of nasociliary branch/hutchinson's sign at this time but I firmly/emphatically  warned patient that if she starts to see any lesions on or in her nose then she needs to immediately seek care at the ED (it is currently Friday), or if happens next week then go to ophthalmologist office or call/come into our office and we'll get her set up wherever needed. For now, start valtrex 1g tid x 7d and prednisone 40mg  qd x 5d. Recommended ibup or tylenol.  She declined any type of rx pain med, including med for neuropathic pain treatment.  -we discussed possible serious and likely etiologies, options for evaluation and workup, limitations of telemedicine visit vs in person visit, treatment, treatment risks and precautions. Pt prefers to treat via telemedicine empirically rather than in person at this moment.     I discussed the assessment and treatment plan with the patient. The patient was provided an opportunity to ask questions and all were answered. The patient agreed with the plan and demonstrated an understanding of the instructions.    F/u: if not signif improving in the next 3-4d or if worsening prior  Signed:  , MD           05/31/2020

## 2020-11-08 DIAGNOSIS — M5441 Lumbago with sciatica, right side: Secondary | ICD-10-CM | POA: Diagnosis not present

## 2020-11-08 DIAGNOSIS — M9901 Segmental and somatic dysfunction of cervical region: Secondary | ICD-10-CM | POA: Diagnosis not present

## 2020-11-08 DIAGNOSIS — M9903 Segmental and somatic dysfunction of lumbar region: Secondary | ICD-10-CM | POA: Diagnosis not present

## 2020-11-08 DIAGNOSIS — M542 Cervicalgia: Secondary | ICD-10-CM | POA: Diagnosis not present

## 2020-11-13 DIAGNOSIS — M9901 Segmental and somatic dysfunction of cervical region: Secondary | ICD-10-CM | POA: Diagnosis not present

## 2020-11-13 DIAGNOSIS — M9903 Segmental and somatic dysfunction of lumbar region: Secondary | ICD-10-CM | POA: Diagnosis not present

## 2020-11-13 DIAGNOSIS — M5441 Lumbago with sciatica, right side: Secondary | ICD-10-CM | POA: Diagnosis not present

## 2020-11-13 DIAGNOSIS — M542 Cervicalgia: Secondary | ICD-10-CM | POA: Diagnosis not present

## 2021-09-26 DIAGNOSIS — M546 Pain in thoracic spine: Secondary | ICD-10-CM | POA: Diagnosis not present

## 2021-09-26 DIAGNOSIS — M9902 Segmental and somatic dysfunction of thoracic region: Secondary | ICD-10-CM | POA: Diagnosis not present

## 2021-09-26 DIAGNOSIS — M9901 Segmental and somatic dysfunction of cervical region: Secondary | ICD-10-CM | POA: Diagnosis not present

## 2021-09-26 DIAGNOSIS — M542 Cervicalgia: Secondary | ICD-10-CM | POA: Diagnosis not present

## 2021-10-01 DIAGNOSIS — M9902 Segmental and somatic dysfunction of thoracic region: Secondary | ICD-10-CM | POA: Diagnosis not present

## 2021-10-01 DIAGNOSIS — M542 Cervicalgia: Secondary | ICD-10-CM | POA: Diagnosis not present

## 2021-10-01 DIAGNOSIS — M546 Pain in thoracic spine: Secondary | ICD-10-CM | POA: Diagnosis not present

## 2021-10-01 DIAGNOSIS — M9901 Segmental and somatic dysfunction of cervical region: Secondary | ICD-10-CM | POA: Diagnosis not present

## 2021-10-28 ENCOUNTER — Telehealth (INDEPENDENT_AMBULATORY_CARE_PROVIDER_SITE_OTHER): Payer: BC Managed Care – PPO | Admitting: Family Medicine

## 2021-10-28 ENCOUNTER — Encounter: Payer: Self-pay | Admitting: Family Medicine

## 2021-10-28 ENCOUNTER — Other Ambulatory Visit: Payer: Self-pay

## 2021-10-28 VITALS — Wt 180.0 lb

## 2021-10-28 DIAGNOSIS — R21 Rash and other nonspecific skin eruption: Secondary | ICD-10-CM | POA: Diagnosis not present

## 2021-10-28 DIAGNOSIS — B029 Zoster without complications: Secondary | ICD-10-CM

## 2021-10-28 MED ORDER — VALACYCLOVIR HCL 1 G PO TABS: 1000.0000 mg | ORAL_TABLET | Freq: Three times a day (TID) | ORAL | 0 refills | Status: AC

## 2021-10-28 MED ORDER — PREDNISONE 20 MG PO TABS: 40.0000 mg | ORAL_TABLET | Freq: Every day | ORAL | 0 refills | Status: DC

## 2021-10-28 NOTE — Progress Notes (Signed)
VIRTUAL VISIT VIA VIDEO  I connected with Janice Blanchard on 10/28/21 at  4:00 PM EST by elemedicine application and verified that I am speaking with the correct person using two identifiers. Location patient: Home Location provider: [facility], Office Persons participating in the virtual visit: Patient, Dr. Raoul Pitch and Darnell Level. Cesar, CMA  I discussed the limitations of evaluation and management by telemedicine and the availability of in person appointments. The patient expressed understanding and agreed to proceed.   SUBJECTIVE Chief Complaint  Patient presents with   Rash    Burning in temple radiating out on right side of face started this morning. Had shingles a year ago.    HPI: Janice Blanchard is a 56 y.o. female present today for new onset rash.  Patient reports she had a shingles outbreak last year along her right temple and over her right eye.  She noticed this morning the burning sensation presenting on the right side of her face which felt exactly like when she had a shingles outbreak.  She denies any visual or hearing changes.  She denies any rash, fever or malaise at this time.  She reports she is under more stress at the moment.  ROS: See pertinent positives and negatives per HPI.  Patient Active Problem List   Diagnosis Date Noted   Vitamin D deficiency 11/07/2014   Hyperlipidemia 11/07/2014   Other and unspecified noninfectious gastroenteritis and colitis(558.9) 03/08/2014   Constipation 03/08/2014   Colitis 01/24/2014   Patellofemoral pain syndrome 01/10/2014   Xanthelasma, eyelid 01/10/2014   PREMATURE ATRIAL CONTRACTIONS 05/29/2009    Social History   Tobacco Use   Smoking status: Every Day    Packs/day: 1.50    Years: 20.00    Pack years: 30.00    Types: Cigarettes    Last attempt to quit: 01/05/2009    Years since quitting: 12.8    Passive exposure: Never   Smokeless tobacco: Never   Tobacco comments:    Quit on 01/05/09 x 22yr then started back   Substance Use Topics   Alcohol use: No    Current Outpatient Medications:    predniSONE (DELTASONE) 20 MG tablet, Take 2 tablets (40 mg total) by mouth daily with breakfast., Disp: 10 tablet, Rfl: 0   valACYclovir (VALTREX) 1000 MG tablet, Take 1 tablet (1,000 mg total) by mouth 3 (three) times daily for 10 days., Disp: 30 tablet, Rfl: 0   VITAMIN D PO, Take 1 capsule by mouth daily., Disp: , Rfl:   Allergies  Allergen Reactions   Eggs Or Egg-Derived Products     OBJECTIVE: Wt 180 lb (81.6 kg)    BMI 30.42 kg/m  Gen: No acute distress. Nontoxic in appearance.  HENT: AT. [place].  MMM.  Eyes:Pupils Equal Round Reactive to light, Extraocular movements intact,  Conjunctiva without redness, discharge or icterus. Skin: No rashes, purpura or petechiae.  Neuro: Alert. Oriented x3  Psych: Normal affect and demeanor. Normal speech. Normal thought content and judgment.  ASSESSMENT AND PLAN: [name] is a 56 y.o. female present for  Rash/Herpes zoster without complication Called in Valtrex 3 times daily and urged patient to start immediately. I called in a 5-day prednisone burst, she will only need to use this if a vesicular rash does not presenting to rounding the eye or ear. Hopefully since we are starting the antiviral as soon as burning sensation presented, she will not find she breaks out to her vesicular rash and not need the  prednisone either. Follow-up in 1-2 weeks if rash not resolved, sooner if needed.  Emergent precautions discussed with patient today.  Howard Pouch, DO 10/28/2021   Return if symptoms worsen or fail to improve.  No orders of the defined types were placed in this encounter.  Meds ordered this encounter  Medications   valACYclovir (VALTREX) 1000 MG tablet    Sig: Take 1 tablet (1,000 mg total) by mouth 3 (three) times daily for 10 days.    Dispense:  30 tablet    Refill:  0   predniSONE (DELTASONE) 20 MG tablet    Sig: Take 2 tablets (40 mg total) by  mouth daily with breakfast.    Dispense:  10 tablet    Refill:  0   Referral Orders  No referral(s) requested today

## 2021-11-17 DIAGNOSIS — M9902 Segmental and somatic dysfunction of thoracic region: Secondary | ICD-10-CM | POA: Diagnosis not present

## 2021-11-17 DIAGNOSIS — M546 Pain in thoracic spine: Secondary | ICD-10-CM | POA: Diagnosis not present

## 2021-11-17 DIAGNOSIS — M9901 Segmental and somatic dysfunction of cervical region: Secondary | ICD-10-CM | POA: Diagnosis not present

## 2021-11-17 DIAGNOSIS — M542 Cervicalgia: Secondary | ICD-10-CM | POA: Diagnosis not present

## 2022-03-31 ENCOUNTER — Ambulatory Visit (INDEPENDENT_AMBULATORY_CARE_PROVIDER_SITE_OTHER): Payer: BC Managed Care – PPO | Admitting: Family Medicine

## 2022-03-31 ENCOUNTER — Encounter: Payer: Self-pay | Admitting: Family Medicine

## 2022-03-31 VITALS — BP 117/69 | HR 86 | Temp 98.0°F | Ht 64.5 in | Wt 188.6 lb

## 2022-03-31 DIAGNOSIS — F322 Major depressive disorder, single episode, severe without psychotic features: Secondary | ICD-10-CM | POA: Diagnosis not present

## 2022-03-31 DIAGNOSIS — R002 Palpitations: Secondary | ICD-10-CM | POA: Diagnosis not present

## 2022-03-31 DIAGNOSIS — F4321 Adjustment disorder with depressed mood: Secondary | ICD-10-CM

## 2022-03-31 DIAGNOSIS — I498 Other specified cardiac arrhythmias: Secondary | ICD-10-CM

## 2022-03-31 DIAGNOSIS — R9431 Abnormal electrocardiogram [ECG] [EKG]: Secondary | ICD-10-CM | POA: Diagnosis not present

## 2022-03-31 NOTE — Patient Instructions (Signed)
[facility] [address]  [phone] Open 24 hours

## 2022-03-31 NOTE — Progress Notes (Signed)
OFFICE VISIT  03/31/2022  CC:  Chief Complaint  Patient presents with   Heart palpitations    Has been going on for 1 month; pt called EMS on Thursday night and EKG done (2); tachycardia noted. Pt c/o issues catching her breath and has not stopped since Thurs   Patient is a 56 y.o. female who presents for palpitations.  HPI: Patient reports that about a month ago she began feeling the sensation of her heart beating forcefully. Seems to be constant but sometimes she notes it more prominent than others.  Seems to be worse with activity.  About 4 days ago it got significantly worse late at night and was pounding.  She called EMS and they evaluated her.  She brought in a rhythm strip and a strip of their twelve-lead EKG today. The rhythm strip shows ventricular trigeminy. The twelve-lead EKG a few minutes later shows no significant abnormality.  Rhythm is sinus, rate is 101.  She smokes cigarettes. No significant alcohol. No drugs.  No over-the-counter medications.  She denies having any presyncope or syncope, chest pain, shortness of breath, dyspnea on exertion, jaw pain, arm pain, PND, orthopnea, dizziness, or lower extremity swelling.  She is constantly under the extreme amount of psychological duress/stress. Her daughter died last year and she is still grieving horribly and says her family has been torn apart by it. States she will not hurt herself or others. She does not want to be on medication.  She is interested in seeing a counselor  ROS as above, plus--> no fevers,no wheezing, no cough, no HAs, no rashes, no melena/hematochezia.  No polyuria or polydipsia.  No myalgias or arthralgias.  No focal weakness, paresthesias, or tremors.  No acute vision or hearing abnormalities.  No dysuria or unusual/new urinary urgency or frequency.  No recent changes in lower legs. No n/v/d or abd pain.   Past Medical History:  Diagnosis Date   PAC (premature atrial contraction)    symptomatic.   Holter monitor showed PACs.  Never took meds.  Spontaneously resolved.   Shingles    Face 2021 and 2023   Vitamin D deficiency     Past Surgical History:  Procedure Laterality Date   CESAREAN SECTION     2003 and 1992    Outpatient Medications Prior to Visit  Medication Sig Dispense Refill   VITAMIN D PO Take 1 capsule by mouth daily.     predniSONE (DELTASONE) 20 MG tablet Take 2 tablets (40 mg total) by mouth daily with breakfast. (Patient not taking: Reported on 03/31/2022) 10 tablet 0   No facility-administered medications prior to visit.    Allergies  Allergen Reactions   Eggs Or Egg-Derived Products     ROS As per HPI  PE:    03/31/2022   11:08 AM 10/28/2021    3:48 PM 05/31/2020    3:11 PM  Vitals with BMI  Height 5' 4.5"  5' 4.5"  Weight 188 lbs 10 oz 180 lbs 180 lbs  BMI 31.88  30.43  Systolic 117    Diastolic 69    Pulse 86     Physical Exam  Gen: Alert, well appearing.  Patient is oriented to person, place, time, and situation. AFFECT: pleasant, lucid thought and speech. CV: RRR (rate 85), no m/r/g.   LUNGS: CTA bilat, nonlabored resps, good aeration in all lung fields. EXT: no clubbing or cyanosis.  no edema.    LABS:  Last CBC Lab Results  Component Value Date  WBC 6.0 11/02/2018   HGB 14.6 11/02/2018   HCT 42.8 11/02/2018   MCV 99.5 11/02/2018   MCH 32.8 01/26/2014   RDW 13.3 11/02/2018   PLT 316.0 11/02/2018   Last metabolic panel Lab Results  Component Value Date   GLUCOSE 96 11/02/2018   NA 141 11/02/2018   K 4.5 11/02/2018   CL 105 11/02/2018   CO2 29 11/02/2018   BUN 12 11/02/2018   CREATININE 0.71 11/02/2018   GFRNONAA 102 11/06/2014   CALCIUM 9.6 11/02/2018   PROT 6.9 11/02/2018   ALBUMIN 4.5 11/02/2018   LABGLOB 2.3 11/06/2014   AGRATIO 1.9 11/06/2014   BILITOT 0.6 11/02/2018   ALKPHOS 63 11/02/2018   AST 18 11/02/2018   ALT 17 11/02/2018   Last thyroid functions Lab Results  Component Value Date   TSH 2.36  11/02/2018   T4TOTAL 8.5 11/06/2014   12 lead EKG today: Normal sinus rhythm, rate 83.  No ectopic beats.  She has delayed R wave transition in precordial leads and T wave inversion in V1 through V3. No ST segment depression or elevation.  Compared to the EKG done by EMS on 03/27/2022 the delayed R wave progression T wave inversions are new.  IMPRESSION AND PLAN:  #1 palpitations. Ventricular trigeminy on rhythm strip about 4 days ago. Currently says she feels the same sensation but heart rate is 85 and regular. Abnormal EKG today but I do not feel like she is having acute ischemia. We will check basic metabolic panel, magnesium, and TSH. Referral to cardiology ordered. No medications prescribed.  #2 major depressive disorder, active, without psychosis. She declines medications. She does want to talk to her counselor so I referred her to the 24/[facility] [place] [facility].  Spent 55 min with pt today reviewing HPI, reviewing relevant past history, doing exam, reviewing and discussing lab and imaging data, empathic listening and determination of level of need and treatment for her psychiatric problems, and formulating plans.  An After Visit Summary was printed and given to the patient.  FOLLOW UP: Return for f/u TBD based on cardiology w/u.  Signed:  Santiago Bumpers, MD           03/31/2022

## 2022-04-01 ENCOUNTER — Other Ambulatory Visit (INDEPENDENT_AMBULATORY_CARE_PROVIDER_SITE_OTHER): Payer: BC Managed Care – PPO

## 2022-04-01 DIAGNOSIS — R9431 Abnormal electrocardiogram [ECG] [EKG]: Secondary | ICD-10-CM

## 2022-04-01 DIAGNOSIS — I498 Other specified cardiac arrhythmias: Secondary | ICD-10-CM | POA: Diagnosis not present

## 2022-04-01 DIAGNOSIS — R002 Palpitations: Secondary | ICD-10-CM

## 2022-04-01 LAB — BASIC METABOLIC PANEL
BUN: 12 mg/dL (ref 6–23)
CO2: 26 mEq/L (ref 19–32)
Calcium: 9.1 mg/dL (ref 8.4–10.5)
Chloride: 105 mEq/L (ref 96–112)
Creatinine, Ser: 0.75 mg/dL (ref 0.40–1.20)
GFR: 88.27 mL/min (ref >60.00)
Glucose, Bld: 148 mg/dL — ABNORMAL HIGH (ref 70–99)
Potassium: 4.5 mEq/L (ref 3.5–5.1)
Sodium: 140 mEq/L (ref 135–145)

## 2022-04-01 LAB — TSH: TSH: 1.96 u[IU]/mL (ref 0.35–5.50)

## 2022-04-01 LAB — MAGNESIUM: Magnesium: 1.9 mg/dL (ref 1.5–2.5)

## 2022-04-02 ENCOUNTER — Telehealth: Payer: Self-pay

## 2022-04-02 NOTE — Telephone Encounter (Signed)
Spoke with pt regarding results/recommendations,voiced understanding. ? ?

## 2022-04-02 NOTE — Telephone Encounter (Signed)
Patient calling back regarding test results.

## 2022-04-03 NOTE — Progress Notes (Signed)
Noted  

## 2022-04-27 DIAGNOSIS — M542 Cervicalgia: Secondary | ICD-10-CM | POA: Diagnosis not present

## 2022-04-27 DIAGNOSIS — M546 Pain in thoracic spine: Secondary | ICD-10-CM | POA: Diagnosis not present

## 2022-04-27 DIAGNOSIS — M9902 Segmental and somatic dysfunction of thoracic region: Secondary | ICD-10-CM | POA: Diagnosis not present

## 2022-04-27 DIAGNOSIS — M9901 Segmental and somatic dysfunction of cervical region: Secondary | ICD-10-CM | POA: Diagnosis not present

## 2022-05-04 ENCOUNTER — Telehealth: Payer: Self-pay | Admitting: Family Medicine

## 2022-05-04 NOTE — Telephone Encounter (Signed)
Pt is scheduled for tomorrow at 11 am for continued symptoms from July. She states that she called the referral for the cardiologist and was told they would have to find out if she is a candidate. I asked who are they needing to find this information out from and she was unsure. She asked that she can be referred to another cardiologist.

## 2022-05-04 NOTE — Telephone Encounter (Signed)
Patient's cardiology referral from 7/25 was supposed to go [facility] cardiology. LM for patient to confirm if she still wants referral location changed.

## 2022-05-04 NOTE — Telephone Encounter (Signed)
Patient is scheduled to see Dr. Milinda Cave tomorrow morning 8/28, she would like to discuss moe in detail with Dr. Milinda Cave tomorrow at appt.  I told her that was fine.  Patient is not needing follow up call at this time. Closed encounter.

## 2022-05-05 ENCOUNTER — Encounter: Payer: Self-pay | Admitting: Family Medicine

## 2022-05-05 ENCOUNTER — Ambulatory Visit (INDEPENDENT_AMBULATORY_CARE_PROVIDER_SITE_OTHER): Payer: BC Managed Care – PPO | Admitting: Family Medicine

## 2022-05-05 VITALS — BP 103/70 | HR 85 | Temp 98.4°F | Ht 64.5 in | Wt 188.8 lb

## 2022-05-05 DIAGNOSIS — R002 Palpitations: Secondary | ICD-10-CM | POA: Diagnosis not present

## 2022-05-05 DIAGNOSIS — I498 Other specified cardiac arrhythmias: Secondary | ICD-10-CM

## 2022-05-05 NOTE — Telephone Encounter (Signed)
Noted  

## 2022-05-05 NOTE — Progress Notes (Signed)
OFFICE VISIT  05/05/2022  CC:  Chief Complaint  Patient presents with   Follow-up    Palpitations; patient does not want to proceed with [facility] location for cardiology. She was told "they had to see if she was a candidate".   Patient is a 56 y.o. female who presents for f/u palpitations. A/P as of last visit: "#1 palpitations. Ventricular trigeminy on rhythm strip about 4 days ago. Currently says she feels the same sensation but heart rate is 85 and regular. Abnormal EKG today but I do not feel like she is having acute ischemia. We will check basic metabolic panel, magnesium, and TSH. Referral to cardiology ordered. No medications prescribed.   #2 major depressive disorder, active, without psychosis. She declines medications. She does want to talk to her counselor so I referred her to the 24/7 [place] [facility] urgent [facility]."  INTERIM HX: Johnnay continues to have palpitations. Having them more regularly and they always seem to be positionally related--lying on her side or when slouching over when in a chair.  If it is quiet she notes her heart beating in her temples when she stands.  This resolves fairly quickly.  Not precipitated by activity. Her symptoms are not associated with dizziness or presyncope, nor is she having any chest pain or shortness of breath. She was never contacted by the cardiology office to schedule an appointment.  Last week she initiated contact with them they told her they would call her back.  Past Medical History:  Diagnosis Date   PAC (premature atrial contraction)    symptomatic.  Holter monitor showed PACs.  Never took meds.  Spontaneously resolved.   Shingles    Face 2021 and 2023   Vitamin D deficiency     Past Surgical History:  Procedure Laterality Date   CESAREAN SECTION     2003 and 1992    Outpatient Medications Prior to Visit  Medication Sig Dispense Refill   VITAMIN D PO Take 1 capsule by mouth daily.     No facility-administered  medications prior to visit.    Allergies  Allergen Reactions   Eggs Or Egg-Derived Products     ROS As per HPI  PE:    05/05/2022   11:08 AM 03/31/2022   11:08 AM 10/28/2021    3:48 PM  Vitals with BMI  Height 5' 4.5" 5' 4.5"   Weight 188 lbs 13 oz 188 lbs 10 oz 180 lbs  BMI 31.92 31.88   Systolic 103 117   Diastolic 70 69   Pulse 85 86    Physical Exam  Gen: Alert, well appearing.  Patient is oriented to person, place, time, and situation.. AFFECT: pleasant, lucid thought and speech. CV: RRR, occasional brief pause, occasional premature beat . no m/r/g.   LUNGS: CTA bilat, nonlabored resps, good aeration in all lung fields. EXT: no clubbing or cyanosis.  no edema.   LABS:  Last CBC Lab Results  Component Value Date   WBC 6.0 11/02/2018   HGB 14.6 11/02/2018   HCT 42.8 11/02/2018   MCV 99.5 11/02/2018   MCH 32.8 01/26/2014   RDW 13.3 11/02/2018   PLT 316.0 11/02/2018   Last metabolic panel Lab Results  Component Value Date   GLUCOSE 148 (H) 04/01/2022   NA 140 04/01/2022   K 4.5 04/01/2022   CL 105 04/01/2022   CO2 26 04/01/2022   BUN 12 04/01/2022   CREATININE 0.75 04/01/2022   GFRNONAA 102 11/06/2014   CALCIUM 9.1 04/01/2022  PROT 6.9 11/02/2018   ALBUMIN 4.5 11/02/2018   LABGLOB 2.3 11/06/2014   AGRATIO 1.9 11/06/2014   BILITOT 0.6 11/02/2018   ALKPHOS 63 11/02/2018   AST 18 11/02/2018   ALT 17 11/02/2018   Last thyroid functions Lab Results  Component Value Date   TSH 1.96 04/01/2022   T4TOTAL 8.5 11/06/2014   IMPRESSION AND PLAN:  #1 palpitations. Ventricular trigeminy on EKG. Will order outpatient rhythm monitoring and continue to work on getting her seen by the cardiologist.  An After Visit Summary was printed and given to the patient.  FOLLOW UP: Return if symptoms worsen or fail to improve.  Signed:  Santiago Bumpers, MD           05/05/2022

## 2022-05-06 ENCOUNTER — Telehealth: Payer: Self-pay | Admitting: Family Medicine

## 2022-05-06 ENCOUNTER — Ambulatory Visit: Payer: PRIVATE HEALTH INSURANCE | Attending: Family Medicine

## 2022-05-06 ENCOUNTER — Encounter: Payer: Self-pay | Admitting: *Deleted

## 2022-05-06 DIAGNOSIS — R002 Palpitations: Secondary | ICD-10-CM

## 2022-05-06 DIAGNOSIS — I498 Other specified cardiac arrhythmias: Secondary | ICD-10-CM

## 2022-05-06 NOTE — Telephone Encounter (Signed)
New referral ordered to [facility]: Dr. Jacinto Halim.

## 2022-05-06 NOTE — Telephone Encounter (Signed)
Please review and advise. Pt seen yesterday for follow up

## 2022-05-06 NOTE — Progress Notes (Unsigned)
Enrolled for [employer] to mail a ZIO XT long term holter monitor to the patients address on file.   Letter with instructions mailed to patient.  DOD to read. 

## 2022-05-06 NOTE — Telephone Encounter (Signed)
The referral for the heart specialist will not see Janice Blanchard due to her not having heart failure. This particular Dr. Only sees heart failure patients is there someone else she can be referred to?

## 2022-05-07 NOTE — Telephone Encounter (Signed)
Patient advised referral completed and will be contacted about heart monitor as well.

## 2022-05-07 NOTE — Telephone Encounter (Signed)
LM for pt regarding referral. 

## 2022-05-07 NOTE — Telephone Encounter (Signed)
Done

## 2022-05-21 NOTE — Telephone Encounter (Signed)
Contacted Dr.Ganji's office's and advised the patient did not have clear instructions for using heart monitor. Asking for someone to return a call to our office for an update.   [4:37 PM] Gerald Stabs  Dr. Milinda Cave ordered heart monitor for patient.  They have rec'd in mail.  Directions are not clear to patient.  Can she have a nurse appt to come by and we put on for her?

## 2022-05-27 ENCOUNTER — Ambulatory Visit: Payer: BC Managed Care – PPO | Admitting: Internal Medicine

## 2022-05-27 ENCOUNTER — Encounter: Payer: Self-pay | Admitting: Internal Medicine

## 2022-05-27 VITALS — BP 138/77 | HR 87 | Temp 98.1°F

## 2022-05-27 DIAGNOSIS — R0609 Other forms of dyspnea: Secondary | ICD-10-CM

## 2022-05-27 DIAGNOSIS — R002 Palpitations: Secondary | ICD-10-CM

## 2022-05-27 DIAGNOSIS — F17210 Nicotine dependence, cigarettes, uncomplicated: Secondary | ICD-10-CM | POA: Diagnosis not present

## 2022-05-27 MED ORDER — METOPROLOL SUCCINATE ER 25 MG PO TB24: 12.5000 mg | ORAL_TABLET | Freq: Every day | ORAL | 2 refills | Status: DC

## 2022-05-27 NOTE — Progress Notes (Signed)
Primary Physician/Referring:  Tammi Sou, MD  Patient ID: Janice Blanchard, female    DOB: [birth year], 56 y.o.   MRN: [id number]  Chief Complaint  Patient presents with   New Patient (Initial Visit)        Referral   HPI:    Janice Blanchard  is a 56 y.o. female with no significant medical history that presents today for evaluation of palpitations and dyspnea on exertion.   She was seen by her PCP last month for the same symptoms and was found to be in sinus rhythm with trigeminy. At that time palpitations were occurring frequently. She was ordered a holter monitor but did not wear this because she did not know how to place the monitor on. She does feel that the palpitations are sometimes positional and are worse when slouching while sitting up or laying on her right side. The palpitations are less frequent because she will adjust her positioning and posture and they will subside. She does have shortness of breath with exertion but this is not new and but worsened. She is not more short of breath with palpitations. She denies chest pain, dizziness, syncope, nausea, or vomiting. She does endorse stressful life events over the past 2 years which include the sudden death of her daughter and martial issues. She became very tearful while discussing this. She has felt significant stress from this and has preferred to be isolated from family. She denies SI.   She does smoke 1.5 packs of cigarettes per day. Denies excessive caffeine of alcohol intake. She is not exercising at this time due to life stressors.   She is here today with her daughter.  Past Medical History:  Diagnosis Date   PAC (premature atrial contraction)    symptomatic.  Holter monitor showed PACs.  Never took meds.  Spontaneously resolved.   Shingles    Face 2021 and 2023   Vitamin D deficiency    Past Surgical History:  Procedure Laterality Date   CESAREAN SECTION     2003 and 1992   Family History  Problem  Relation Age of Onset   Alcohol abuse Father    Cancer Father    Deafness Daughter    Hepatitis Daughter        autoimmune   Alzheimer's disease Maternal Grandfather    Alzheimer's disease Mother    Diabetes Paternal Grandmother    Diabetes Paternal Grandfather    Cancer Paternal Grandfather     Social History   Tobacco Use   Smoking status: Every Day    Packs/day: 1.50    Years: 20.00    Total pack years: 30.00    Types: Cigarettes    Last attempt to quit: 01/05/2009    Years since quitting: 13.3    Passive exposure: Never   Smokeless tobacco: Never   Tobacco comments:    Quit on 01/05/09 x 40yr then started back  Substance Use Topics   Alcohol use: No   Marital Status: Married  ROS  Review of Systems  Cardiovascular:  Positive for dyspnea on exertion, irregular heartbeat and palpitations. Negative for chest pain, claudication, leg swelling, near-syncope, paroxysmal nocturnal dyspnea and syncope.  Respiratory:  Positive for shortness of breath.    Objective  Blood pressure 138/77, pulse 87, temperature 98.1 F (36.7 C), temperature source Temporal, SpO2 97 %. There is no height or weight on file to calculate BMI.     05/27/2022    1:06 PM 05/05/2022  11:08 AM 03/31/2022   11:08 AM  Vitals with BMI  Height  5' 4.5" 5' 4.5"  Weight  188 lbs 13 oz 188 lbs 10 oz  BMI  [id number]  Systolic 0000000 [id number]  Diastolic 77 70 69  Pulse 87 85 86     Physical Exam Constitutional:      Appearance: Normal appearance.  Cardiovascular:     Rate and Rhythm: Normal rate and regular rhythm. Occasional Extrasystoles are present.    Pulses:          Carotid pulses are 2+ on the right side and 2+ on the left side.      Radial pulses are 2+ on the right side and 2+ on the left side.       Femoral pulses are 2+ on the right side and 2+ on the left side.      Popliteal pulses are 1+ on the right side and 1+ on the left side.       Dorsalis pedis pulses are 2+ on the right side and 2+ on  the left side.       Posterior tibial pulses are 2+ on the right side and 2+ on the left side.     Heart sounds: Normal heart sounds. No murmur heard. Pulmonary:     Effort: Pulmonary effort is normal. No respiratory distress.     Breath sounds: Normal breath sounds. No wheezing or rales.  Abdominal:     General: Bowel sounds are normal.     Palpations: Abdomen is soft.  Musculoskeletal:     Right lower leg: No edema.     Left lower leg: No edema.  Neurological:     Mental Status: She is alert.    Medications and allergies   Allergies  Allergen Reactions   Eggs Or Egg-Derived Products      Medication list after today's encounter   Current Outpatient Medications:    metoprolol succinate (TOPROL XL) 25 MG 24 hr tablet, Take 0.5 tablets (12.5 mg total) by mouth daily., Disp: 30 tablet, Rfl: 2   VITAMIN D PO, Take 1 capsule by mouth daily., Disp: , Rfl:   Laboratory examination:   Lab Results  Component Value Date   NA 140 04/01/2022   K 4.5 04/01/2022   CO2 26 04/01/2022   GLUCOSE 148 (H) 04/01/2022   BUN 12 04/01/2022   CREATININE 0.75 04/01/2022   CALCIUM 9.1 04/01/2022   GFRNONAA 102 11/06/2014       Latest Ref Rng & Units 04/01/2022   11:11 AM 11/02/2018    9:57 AM 11/06/2014    9:13 AM  CMP  Glucose 70 - 99 mg/dL 148  96  103   BUN 6 - 23 mg/dL 12  12  13    Creatinine 0.40 - 1.20 mg/dL 0.75  0.71  0.69   Sodium 135 - 145 mEq/L 140  141  140   Potassium 3.5 - 5.1 mEq/L 4.5  4.5  4.3   Chloride 96 - 112 mEq/L 105  105  104   CO2 19 - 32 mEq/L 26  29  21    Calcium 8.4 - 10.5 mg/dL 9.1  9.6  9.4   Total Protein 6.0 - 8.3 g/dL  6.9  6.6   Total Bilirubin 0.2 - 1.2 mg/dL  0.6  0.5   Alkaline Phos 39 - 117 U/L  63  66   AST 0 - 37 U/L  18  20   ALT 0 -  35 U/L  17  29       Latest Ref Rng & Units 11/02/2018    9:57 AM 01/26/2014    4:45 AM 01/24/2014    1:03 PM  CBC  WBC 4.0 - 10.5 K/uL 6.0  8.5  14.3   Hemoglobin 12.0 - 15.0 g/dL 14.6  12.3  14.6    Hematocrit 36.0 - 46.0 % 42.8  37.0  43.5   Platelets 150.0 - 400.0 K/uL 316.0  234  330     Lipid Panel No results for input(s): "CHOL", "TRIG", "LDLCALC", "VLDL", "HDL", "CHOLHDL", "LDLDIRECT" in the last 8760 hours.  HEMOGLOBIN A1C No results found for: "HGBA1C", "MPG" TSH Recent Labs    04/01/22 1111  TSH 1.96    External labs:     Radiology:    Cardiac Studies:   No results found for this or any previous visit from the past 1095 days.    EKG:   EKG 05/27/2022: This rhythm with a rate of 83 bpm with single PVC.  Normal axis.  Depression noted in V3 through V6.  Artifact in limb leads.  Previous EKG trigeminy is no longer present.  Assessment     ICD-10-CM   1. Palpitations  R00.2 EKG 12-Lead    PCV ECHOCARDIOGRAM COMPLETE    PCV MYOCARDIAL PERFUSION WO LEXISCAN    Lipid Panel With LDL/HDL Ratio    2. Dyspnea on exertion  R06.09 PCV ECHOCARDIOGRAM COMPLETE    PCV MYOCARDIAL PERFUSION WO LEXISCAN    Lipid Panel With LDL/HDL Ratio       Orders Placed This Encounter  Procedures   Lipid Panel With LDL/HDL Ratio   PCV MYOCARDIAL PERFUSION WO LEXISCAN    Standing Status:   Future    Standing Expiration Date:   07/27/2022   EKG 12-Lead   PCV ECHOCARDIOGRAM COMPLETE    Standing Status:   Future    Standing Expiration Date:   05/28/2023    Meds ordered this encounter  Medications   metoprolol succinate (TOPROL XL) 25 MG 24 hr tablet    Sig: Take 0.5 tablets (12.5 mg total) by mouth daily.    Dispense:  30 tablet    Refill:  2    Order Specific Question:   Supervising Provider    Answer:   Adrian Prows [[id number]]    There are no discontinued medications.   Recommendations:   CASH MEADOW is a 56 y.o. female with no significant medical history that presents today for evaluation of palpitations and dyspnea on exertion.   Palpitations Dyspnea on exertion Given significant life stressors and presenting symptoms, suspicious for Takotsubo cardiomyopathy but  cannot rule out cardiac ischemia given risk factors.  Labs reviewed, TSH normal. Echo and stress test ordered. Will start TroprolXL 12.5mg  daily for palpitations. Discussed checking blood pressure at home and symptoms of low blood pressure and low heart rate. LDL elevated on previous lipid panel in 2020. Pt would like to hold off on starting statin until lab work is complete. Ordered lipid panel. Discussed stress reduction methods and smoking cessation. She is not ready to quit at this time. She does not want to be started on antidepressant or antianxiety medication at this time. She will follow-up with her PCP if this changes.  Follow-up in 6 weeks or sooner if needed.    Ernst Spell, NP  05/27/2022, 2:33 PM Office: [phone] Pager: [phone]

## 2022-06-15 ENCOUNTER — Other Ambulatory Visit: Payer: BC Managed Care – PPO

## 2022-06-17 DIAGNOSIS — M542 Cervicalgia: Secondary | ICD-10-CM | POA: Diagnosis not present

## 2022-06-17 DIAGNOSIS — M546 Pain in thoracic spine: Secondary | ICD-10-CM | POA: Diagnosis not present

## 2022-06-17 DIAGNOSIS — M9902 Segmental and somatic dysfunction of thoracic region: Secondary | ICD-10-CM | POA: Diagnosis not present

## 2022-06-17 DIAGNOSIS — M9901 Segmental and somatic dysfunction of cervical region: Secondary | ICD-10-CM | POA: Diagnosis not present

## 2022-06-24 ENCOUNTER — Ambulatory Visit: Payer: BC Managed Care – PPO

## 2022-06-24 ENCOUNTER — Other Ambulatory Visit: Payer: BC Managed Care – PPO

## 2022-06-24 DIAGNOSIS — R0609 Other forms of dyspnea: Secondary | ICD-10-CM

## 2022-06-24 DIAGNOSIS — R002 Palpitations: Secondary | ICD-10-CM

## 2022-06-26 NOTE — Progress Notes (Signed)
Called Patient, patient phone number is no longer in service

## 2022-07-01 ENCOUNTER — Ambulatory Visit: Payer: BC Managed Care – PPO

## 2022-07-01 ENCOUNTER — Other Ambulatory Visit: Payer: BC Managed Care – PPO

## 2022-07-01 VITALS — BP 115/71 | HR 82 | Resp 16 | Ht 64.0 in | Wt 188.0 lb

## 2022-07-01 DIAGNOSIS — R002 Palpitations: Secondary | ICD-10-CM

## 2022-07-01 DIAGNOSIS — R0609 Other forms of dyspnea: Secondary | ICD-10-CM | POA: Diagnosis not present

## 2022-07-01 DIAGNOSIS — R0789 Other chest pain: Secondary | ICD-10-CM | POA: Diagnosis not present

## 2022-07-01 NOTE — Progress Notes (Signed)
Primary Physician/Referring:  Tammi Sou, MD  Patient ID: Janice Blanchard, female    DOB: November 15, 1965, 56 y.o.   MRN: [id number]  Chief Complaint  Patient presents with   Palpitations   Follow-up   HPI:    Janice Blanchard  is a 57 y.o. female with no significant medical history that presents today for evaluation of palpitations and dyspnea on exertion.   She presents today for follow-up. She is still having palpitations and increased dyspnea with exertion. She also endorses mild chest discomfort with palpitations. She has been taking ToprolXL and feels that palpitations have slightly improved. She does endorse stressful life events over the past 3 years which include the sudden death of her daughter and martial issues. She has stopped doing activities that she use to enjoy and is overall sedentary. She does smoke 1.5 packs of cigarettes per day. Denies excessive caffeine of alcohol intake.  Past Medical History:  Diagnosis Date   PAC (premature atrial contraction)    symptomatic.  Holter monitor showed PACs.  Never took meds.  Spontaneously resolved.   Shingles    Face 2021 and 2023   Vitamin D deficiency    Past Surgical History:  Procedure Laterality Date   CESAREAN SECTION     2003 and 1992   Family History  Problem Relation Age of Onset   Alcohol abuse Father    Cancer Father    Deafness Daughter    Hepatitis Daughter        autoimmune   Alzheimer's disease Maternal Grandfather    Alzheimer's disease Mother    Diabetes Paternal Grandmother    Diabetes Paternal Grandfather    Cancer Paternal Grandfather     Social History   Tobacco Use   Smoking status: Every Day    Packs/day: 1.50    Years: 20.00    Total pack years: 30.00    Types: Cigarettes    Last attempt to quit: 01/05/2009    Years since quitting: 13.4    Passive exposure: Never   Smokeless tobacco: Never   Tobacco comments:    Quit on 01/05/09 x 60yr then started back  Substance Use Topics    Alcohol use: No   Marital Status: Married  ROS  Review of Systems  Cardiovascular:  Positive for dyspnea on exertion, irregular heartbeat and palpitations. Negative for chest pain, claudication, leg swelling, near-syncope, paroxysmal nocturnal dyspnea and syncope.  Respiratory:  Positive for shortness of breath.    Objective  Blood pressure 115/71, pulse 82, resp. rate 16, height 5\' 4"  (1.626 m), weight 188 lb (85.3 kg), SpO2 96 %. Body mass index is 32.27 kg/m.     07/01/2022    2:13 PM 05/27/2022    1:06 PM 05/05/2022   11:08 AM  Vitals with BMI  Height 5\' 4"   5' 4.5"  Weight 188 lbs  188 lbs 13 oz  BMI [id number]  [id number]  Systolic [id number]  Diastolic 71 77 70  Pulse 82 87 85     Physical Exam Constitutional:      Appearance: Normal appearance.  Cardiovascular:     Rate and Rhythm: Normal rate and regular rhythm. Occasional Extrasystoles are present.    Pulses:          Carotid pulses are 2+ on the right side and 2+ on the left side.      Radial pulses are 2+ on the right side and 2+ on the left side.  Femoral pulses are 2+ on the right side and 2+ on the left side.      Popliteal pulses are 1+ on the right side and 1+ on the left side.       Dorsalis pedis pulses are 2+ on the right side and 2+ on the left side.       Posterior tibial pulses are 2+ on the right side and 2+ on the left side.     Heart sounds: Normal heart sounds. No murmur heard. Pulmonary:     Effort: Pulmonary effort is normal. No respiratory distress.     Breath sounds: Normal breath sounds. No wheezing or rales.  Abdominal:     General: Bowel sounds are normal.     Palpations: Abdomen is soft.  Musculoskeletal:     Right lower leg: No edema.     Left lower leg: No edema.  Neurological:     Mental Status: She is alert.    Medications and allergies   Allergies  Allergen Reactions   Eggs Or Egg-Derived Products      Medication list after today's encounter   Current Outpatient Medications:     metoprolol succinate (TOPROL XL) 25 MG 24 hr tablet, Take 0.5 tablets (12.5 mg total) by mouth daily., Disp: 30 tablet, Rfl: 2   VITAMIN D PO, Take 1 capsule by mouth daily., Disp: , Rfl:   Laboratory examination:   Lab Results  Component Value Date   NA 140 04/01/2022   K 4.5 04/01/2022   CO2 26 04/01/2022   GLUCOSE 148 (H) 04/01/2022   BUN 12 04/01/2022   CREATININE 0.75 04/01/2022   CALCIUM 9.1 04/01/2022   GFRNONAA 102 11/06/2014       Latest Ref Rng & Units 04/01/2022   11:11 AM 11/02/2018    9:57 AM 11/06/2014    9:13 AM  CMP  Glucose 70 - 99 mg/dL 148  96  103   BUN 6 - 23 mg/dL 12  12  13    Creatinine 0.40 - 1.20 mg/dL 0.75  0.71  0.69   Sodium 135 - 145 mEq/L 140  141  140   Potassium 3.5 - 5.1 mEq/L 4.5  4.5  4.3   Chloride 96 - 112 mEq/L 105  105  104   CO2 19 - 32 mEq/L 26  29  21    Calcium 8.4 - 10.5 mg/dL 9.1  9.6  9.4   Total Protein 6.0 - 8.3 g/dL  6.9  6.6   Total Bilirubin 0.2 - 1.2 mg/dL  0.6  0.5   Alkaline Phos 39 - 117 U/L  63  66   AST 0 - 37 U/L  18  20   ALT 0 - 35 U/L  17  29       Latest Ref Rng & Units 11/02/2018    9:57 AM 01/26/2014    4:45 AM 01/24/2014    1:03 PM  CBC  WBC 4.0 - 10.5 K/uL 6.0  8.5  14.3   Hemoglobin 12.0 - 15.0 g/dL 14.6  12.3  14.6   Hematocrit 36.0 - 46.0 % 42.8  37.0  43.5   Platelets 150.0 - 400.0 K/uL 316.0  234  330     Lipid Panel No results for input(s): "CHOL", "TRIG", "LDLCALC", "VLDL", "HDL", "CHOLHDL", "LDLDIRECT" in the last 8760 hours.  HEMOGLOBIN A1C No results found for: "HGBA1C", "MPG" TSH Recent Labs    04/01/22 1111  TSH 1.96    External labs:  Radiology:    Cardiac Studies:   Echocardiogram 06/24/2022:  Normal LV systolic function with visual EF 60-65%. Left ventricle cavity  is normal in size. Normal global wall motion. Normal diastolic filling  pattern, normal LAP. Calculated EF 61%.  Mild (Grade I) mitral regurgitation. Mild mitral valve leaflet thickening.  Structurally  normal tricuspid valve.  Mild tricuspid regurgitation. No  evidence of pulmonary hypertension.  No prior available for comparison.    EKG:   EKG 05/27/2022: This rhythm with a rate of 83 bpm with single PVC.  Normal axis.  Depression noted in V3 through V6.  Artifact in limb leads.  Previous EKG trigeminy is no longer present.  Assessment     ICD-10-CM   1. Palpitations  R00.2 LONG TERM MONITOR (3-14 DAYS)    2. Atypical chest pain  R07.89 CT CORONARY MORPH W/CTA COR W/SCORE W/CA W/CM &/OR WO/CM    3. Dyspnea on exertion  R06.09 CT CORONARY MORPH W/CTA COR W/SCORE W/CA W/CM &/OR WO/CM       Orders Placed This Encounter  Procedures   CT CORONARY MORPH W/CTA COR W/SCORE W/CA W/CM &/OR WO/CM    Standing Status:   Future    Standing Expiration Date:   07/02/2023    Order Specific Question:   If indicated for the ordered procedure, I authorize the administration of contrast media per Radiology protocol    Answer:   Yes    Order Specific Question:   Preferred Imaging Location?    Answer:   [facility]    Order Specific Question:   Is patient pregnant?    Answer:   No   LONG TERM MONITOR (3-14 DAYS)    Standing Status:   Future    Order Specific Question:   Where should this test be performed?    Answer:   [facility]    Order Specific Question:   Does the patient have an implanted cardiac device?    Answer:   No    Order Specific Question:   Prescribed days of wear    Answer:   3    Order Specific Question:   Type of enrollment    Answer:   Clinic Enrollment    No orders of the defined types were placed in this encounter.   There are no discontinued medications.   Recommendations:   Janice Blanchard is a 56 y.o. female with no significant medical history that presents today for evaluation of palpitations and dyspnea on exertion.   Palpitations Given ongoing palpitations will order cardiac event monitor to rule out other arrhythmias. Continue ToprolXL as  ordered, may increase dose to 25mg  if palpitations worsen. Recent labs reviewed.  Atypical chest pain Dyspnea on exertion Reviewed results of echocardiogram with patient. Overall normal without significant findings. I did order stress test during last visit and she is unable to complete this due timing. Given worsening dyspnea with exertion and atypical chest pain will place order for coronary CT. I have advised her to have lipid profile checked and she will do this tomorrow.   Discussed importance of regular exercise and lifestyle modifications as well as stress reduction. Smoking cessation also discussed with patient. She continues to smoke 1.5 packs per day. She is not ready to quit at this time. She does not want to be started on antidepressant or antianxiety medication at this time.  Her life stressors include death of her daughter 3 years ago and martial issues. Feel that these stressors could be  contributing to symptoms but cannot completely exclude cardiac etiology.   Follow-up after testing or sooner if needed.    Ernst Spell, AGNP-C  07/01/2022, 3:13 PM Office: [phone] Pager: [phone]

## 2022-07-03 ENCOUNTER — Other Ambulatory Visit: Payer: Self-pay

## 2022-07-03 ENCOUNTER — Encounter: Payer: Self-pay | Admitting: Family Medicine

## 2022-07-03 DIAGNOSIS — R002 Palpitations: Secondary | ICD-10-CM

## 2022-07-03 DIAGNOSIS — R0609 Other forms of dyspnea: Secondary | ICD-10-CM

## 2022-07-03 DIAGNOSIS — R0789 Other chest pain: Secondary | ICD-10-CM

## 2022-07-08 DIAGNOSIS — R0789 Other chest pain: Secondary | ICD-10-CM | POA: Diagnosis not present

## 2022-07-08 DIAGNOSIS — R0609 Other forms of dyspnea: Secondary | ICD-10-CM | POA: Diagnosis not present

## 2022-07-08 DIAGNOSIS — R002 Palpitations: Secondary | ICD-10-CM | POA: Diagnosis not present

## 2022-07-09 LAB — BASIC METABOLIC PANEL
BUN/Creatinine Ratio: 11 (ref 9–23)
BUN: 9 mg/dL (ref 6–24)
CO2: 22 mmol/L (ref 20–29)
Calcium: 9.7 mg/dL (ref 8.7–10.2)
Chloride: 104 mmol/L (ref 96–106)
Creatinine, Ser: 0.79 mg/dL (ref 0.57–1.00)
Glucose: 104 mg/dL — ABNORMAL HIGH (ref 70–99)
Potassium: 4.5 mmol/L (ref 3.5–5.2)
Sodium: 141 mmol/L (ref 134–144)
eGFR: 87 mL/min/{1.73_m2} (ref >59)

## 2022-07-09 LAB — LIPID PANEL WITH LDL/HDL RATIO
Cholesterol, Total: 223 mg/dL — ABNORMAL HIGH (ref 100–199)
HDL: 49 mg/dL (ref >39)
LDL Chol Calc (NIH): 157 mg/dL — ABNORMAL HIGH (ref 0–99)
LDL/HDL Ratio: 3.2 ratio (ref 0.0–3.2)
Triglycerides: 97 mg/dL (ref 0–149)
VLDL Cholesterol Cal: 17 mg/dL (ref 5–40)

## 2022-07-13 ENCOUNTER — Telehealth (HOSPITAL_COMMUNITY): Payer: Self-pay | Admitting: Emergency Medicine

## 2022-07-13 NOTE — Telephone Encounter (Signed)
Attempted to call patient regarding upcoming cardiac CT appointment. °Left message on voicemail with name and callback number °Chapin Arduini RN Navigator Cardiac Imaging °[facility] °[phone] Office °[phone] Cell ° °

## 2022-07-14 ENCOUNTER — Other Ambulatory Visit: Payer: Self-pay

## 2022-07-14 ENCOUNTER — Ambulatory Visit (HOSPITAL_COMMUNITY)
Admission: RE | Admit: 2022-07-14 | Discharge: 2022-07-14 | Disposition: A | Discharge status: Home / Self Care | Payer: BC Managed Care – PPO | Source: Ambulatory Visit | Attending: Family Medicine | Admitting: Family Medicine

## 2022-07-14 DIAGNOSIS — R0789 Other chest pain: Secondary | ICD-10-CM | POA: Diagnosis not present

## 2022-07-14 DIAGNOSIS — R0609 Other forms of dyspnea: Secondary | ICD-10-CM | POA: Insufficient documentation

## 2022-07-14 MED ORDER — METOPROLOL TARTRATE 5 MG/5ML IV SOLN
INTRAVENOUS | Status: AC
  Administered 2022-07-14: 10 mg via INTRAVENOUS
  Filled 2022-07-14: qty 10

## 2022-07-14 MED ORDER — NITROGLYCERIN 0.4 MG SL SUBL
SUBLINGUAL_TABLET | SUBLINGUAL | Status: AC
  Filled 2022-07-14: qty 2

## 2022-07-14 MED ORDER — IOHEXOL 350 MG/ML SOLN
100.0000 mL | Freq: Once | INTRAVENOUS | Status: AC | PRN
  Administered 2022-07-14: 100 mL via INTRAVENOUS

## 2022-07-14 MED ORDER — NITROGLYCERIN 0.4 MG SL SUBL
0.8000 mg | SUBLINGUAL_TABLET | Freq: Once | SUBLINGUAL | Status: AC
  Administered 2022-07-14: 0.8 mg via SUBLINGUAL

## 2022-07-14 MED ORDER — METOPROLOL TARTRATE 5 MG/5ML IV SOLN
10.0000 mg | Freq: Once | INTRAVENOUS | Status: AC
  Administered 2022-07-14: 10 mg via INTRAVENOUS

## 2022-07-14 MED ORDER — METOPROLOL TARTRATE 5 MG/5ML IV SOLN: 10.0000 mg | Freq: Once | INTRAVENOUS | Status: AC

## 2022-07-14 MED ORDER — METOPROLOL SUCCINATE ER 25 MG PO TB24: 25.0000 mg | ORAL_TABLET | Freq: Every day | ORAL | 2 refills | Status: DC

## 2022-07-14 MED ORDER — METOPROLOL TARTRATE 5 MG/5ML IV SOLN
INTRAVENOUS | Status: AC
  Filled 2022-07-14: qty 10

## 2022-07-16 DIAGNOSIS — R002 Palpitations: Secondary | ICD-10-CM | POA: Diagnosis not present

## 2022-07-21 DIAGNOSIS — R002 Palpitations: Secondary | ICD-10-CM | POA: Diagnosis not present

## 2022-10-26 DIAGNOSIS — M546 Pain in thoracic spine: Secondary | ICD-10-CM | POA: Diagnosis not present

## 2022-10-26 DIAGNOSIS — G43009 Migraine without aura, not intractable, without status migrainosus: Secondary | ICD-10-CM | POA: Diagnosis not present

## 2022-10-26 DIAGNOSIS — M9901 Segmental and somatic dysfunction of cervical region: Secondary | ICD-10-CM | POA: Diagnosis not present

## 2022-10-26 DIAGNOSIS — M9902 Segmental and somatic dysfunction of thoracic region: Secondary | ICD-10-CM | POA: Diagnosis not present

## 2023-03-31 DIAGNOSIS — M9902 Segmental and somatic dysfunction of thoracic region: Secondary | ICD-10-CM | POA: Diagnosis not present

## 2023-03-31 DIAGNOSIS — M9901 Segmental and somatic dysfunction of cervical region: Secondary | ICD-10-CM | POA: Diagnosis not present

## 2023-03-31 DIAGNOSIS — M546 Pain in thoracic spine: Secondary | ICD-10-CM | POA: Diagnosis not present

## 2023-03-31 DIAGNOSIS — M542 Cervicalgia: Secondary | ICD-10-CM | POA: Diagnosis not present

## 2023-06-22 DIAGNOSIS — H9203 Otalgia, bilateral: Secondary | ICD-10-CM | POA: Diagnosis not present

## 2023-06-22 DIAGNOSIS — H6523 Chronic serous otitis media, bilateral: Secondary | ICD-10-CM | POA: Diagnosis not present

## 2023-06-22 DIAGNOSIS — Z6832 Body mass index (BMI) 32.0-32.9, adult: Secondary | ICD-10-CM | POA: Diagnosis not present

## 2023-06-22 DIAGNOSIS — R030 Elevated blood-pressure reading, without diagnosis of hypertension: Secondary | ICD-10-CM | POA: Diagnosis not present

## 2024-01-27 ENCOUNTER — Ambulatory Visit: Admitting: Family Medicine

## 2024-01-28 ENCOUNTER — Encounter: Payer: Self-pay | Admitting: Family Medicine

## 2024-01-28 ENCOUNTER — Ambulatory Visit: Admitting: Family Medicine

## 2024-01-28 VITALS — BP 114/59 | HR 87 | Ht 64.0 in | Wt 176.4 lb

## 2024-01-28 DIAGNOSIS — H9042 Sensorineural hearing loss, unilateral, left ear, with unrestricted hearing on the contralateral side: Secondary | ICD-10-CM | POA: Diagnosis not present

## 2024-01-28 DIAGNOSIS — H8102 Meniere's disease, left ear: Secondary | ICD-10-CM

## 2024-01-28 DIAGNOSIS — E878 Other disorders of electrolyte and fluid balance, not elsewhere classified: Secondary | ICD-10-CM | POA: Diagnosis not present

## 2024-01-28 MED ORDER — TRIAMTERENE-HCTZ 37.5-25 MG PO TABS: ORAL_TABLET | ORAL | 0 refills | Status: DC

## 2024-01-28 NOTE — Progress Notes (Signed)
 OFFICE VISIT  02/10/2024  CC:  Chief Complaint  Patient presents with   Ear Concern    Starting 1 year ago, fluids in ear causing vertigo, no infection. Advised by urgent care to use nasal spray and antihistamine, wait 30 days and f/u if no improvement. Currently using otc cetrizine tab    Patient is a 58 y.o. female who presents for concern about her ears.  HPI: Worries about left ear sensation of fullness. She has a 10-year history of significant decreased hearing in the left ear and ringing in left ear. A chronic sense of disequilibrium and episodic severe vertigo with nausea.  Movement in general does make her disequilibrium worse but states that she also just gets random periods of vertigo unrelated to body movements. Recently she was seen at an urgent care and diagnosed with fluid in her ears.  She was advised to use over-the-counter antihistamine and nasal spray and follow-up with PCP if not better in 30 days.  She denies any significant nasal mucus, sneezing, sinus pressure or pain, postnasal drip, or cough.  No headaches, tremor, focal weakness, paresthesias, or vision abnormalities.  Fevers.   Past Medical History:  Diagnosis Date   PAC (premature atrial contraction)    symptomatic.  Holter monitor showed PACs.  Never took meds.  Spontaneously resolved. CT coronary morph 2023 NO SIGNIF CAD   Shingles    Face 2021 and 2023   Vitamin D  deficiency     Past Surgical History:  Procedure Laterality Date   CESAREAN SECTION     2003 and 1992   TRANSTHORACIC ECHOCARDIOGRAM     06/24/22 mild MR and TR    Outpatient Medications Prior to Visit  Medication Sig Dispense Refill   VITAMIN D  PO Take 1 capsule by mouth daily.     metoprolol  succinate (TOPROL  XL) 25 MG 24 hr tablet Take 1 tablet (25 mg total) by mouth daily. (Patient not taking: Reported on 01/28/2024) 30 tablet 2   No facility-administered medications prior to visit.    Allergies  Allergen Reactions    Egg-Derived Products     Review of Systems  As per HPI  PE:    01/28/2024    3:22 PM 07/14/2022    4:07 PM 07/14/2022    3:43 PM  Vitals with BMI  Height 5\' 4"     Weight 176 lbs 6 oz    BMI 30.26    Systolic 114 139 161  Diastolic 59 82 71  Pulse 87 70 68    Physical Exam  Enteral: Alert and well-appearing ENT: Ears: EACs clear, normal epithelium.  TMs with good light reflex and landmarks bilaterally.  Eyes: no injection, icteris, swelling, or exudate.  EOMI, PERRLA. Nose: no drainage or turbinate edema/swelling.  No injection or focal lesion.  Mouth: lips without lesion/swelling.  Oral mucosa pink and moist.  Dentition intact and without obvious caries or gingival swelling.  Oropharynx without erythema, exudate, or swelling.  She cannot hear at all out of the left ear. Weber: Lateralizes to the good ear (right) Rinne: In the right ear air conduction greater than bone conduction.  Left ear she hears nothing. Neuro: CN 2-12 intact bilaterally, strength 5/5 in proximal and distal upper extremities and lower extremities bilaterally.   No tremor.  No disdiadochokinesis.  No ataxia.  Upper extremity and lower extremity DTRs symmetric.  No pronator drift.   LABS:  Last CBC Lab Results  Component Value Date   WBC 6.0 11/02/2018   HGB  14.6 11/02/2018   HCT 42.8 11/02/2018   MCV 99.5 11/02/2018   MCH 32.8 01/26/2014   RDW 13.3 11/02/2018   PLT 316.0 11/02/2018   Last metabolic panel Lab Results  Component Value Date   GLUCOSE 104 (H) 07/08/2022   NA 141 07/08/2022   K 4.5 07/08/2022   CL 104 07/08/2022   CO2 22 07/08/2022   BUN 9 07/08/2022   CREATININE 0.79 07/08/2022   EGFR 87 07/08/2022   CALCIUM 9.7 07/08/2022   PROT 6.9 11/02/2018   ALBUMIN 4.5 11/02/2018   LABGLOB 2.3 11/06/2014   AGRATIO 1.9 11/06/2014   BILITOT 0.6 11/02/2018   ALKPHOS 63 11/02/2018   AST 18 11/02/2018   ALT 17 11/02/2018    IMPRESSION AND PLAN:  Disequilibrium syndrome, hearing loss,  tinnitus.  This is all chronic. 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 Will do trial of Maxide 37.5/25, one half tab twice daily.  An After Visit Summary was printed and given to the patient.  FOLLOW UP: Return in about 2 weeks (around 02/11/2024) for recheck of current acute problem.  Signed:  Arletha Lady, MD           02/10/2024

## 2024-05-22 ENCOUNTER — Encounter: Payer: Self-pay | Admitting: Family Medicine

## 2024-05-22 ENCOUNTER — Ambulatory Visit: Admitting: Family Medicine

## 2024-05-22 VITALS — BP 114/80 | HR 91 | Temp 98.3°F | Ht 64.0 in | Wt 168.4 lb

## 2024-05-22 DIAGNOSIS — H8102 Meniere's disease, left ear: Secondary | ICD-10-CM | POA: Diagnosis not present

## 2024-05-22 LAB — BASIC METABOLIC PANEL WITH GFR
BUN: 14 mg/dL (ref 6–23)
CO2: 23 meq/L (ref 19–32)
Calcium: 10.2 mg/dL (ref 8.4–10.5)
Chloride: 102 meq/L (ref 96–112)
Creatinine, Ser: 0.78 mg/dL (ref 0.40–1.20)
GFR: 82.96 mL/min (ref >60.00)
Glucose, Bld: 98 mg/dL (ref 70–99)
Potassium: 4.3 meq/L (ref 3.5–5.1)
Sodium: 135 meq/L (ref 135–145)

## 2024-05-22 MED ORDER — TRIAMTERENE-HCTZ 37.5-25 MG PO TABS: ORAL_TABLET | ORAL | 1 refills | Status: DC

## 2024-05-22 NOTE — Progress Notes (Addendum)
 OFFICE VISIT  05/22/2024  CC:  Chief Complaint  Patient presents with   Follow-up    Vertigo; last occurrence last Monday; had 3 events since 5/23    Patient is a 58 y.o. female who presents for follow-up of vertigo. I last saw her on 01/28/2024, made provisional diagnosis of Mnire's disease and started a trial of Maxide 37.5/25, one half tab twice daily.  INTERIM HX: No significant change. She waited about a month before starting the Maxzide. She still has a severe bout of her vertigo about once every 2 weeks--> acute onset of diminished hearing and increase in tinnitus, spinning, nausea with vomiting, diarrhea, and feeling of hot flush from the neck up.  After the first day most of her symptoms have gone but she feels like she is hung over, general malaise for about a week.  She then has about a week of feeling back to normal and then another episode occurs.  She has no headaches, no tremor, no focal weakness, no visual abnormalities, no fevers, no abdominal pain.  Past Medical History:  Diagnosis Date   PAC (premature atrial contraction)    symptomatic.  Holter monitor showed PACs.  Never took meds.  Spontaneously resolved. CT coronary morph 2023 NO SIGNIF CAD   Shingles    Face 2021 and 2023   Vitamin D  deficiency     Past Surgical History:  Procedure Laterality Date   CESAREAN SECTION     2003 and 1992   TRANSTHORACIC ECHOCARDIOGRAM     06/24/22 mild MR and TR    Outpatient Medications Prior to Visit  Medication Sig Dispense Refill   Cetirizine HCl (ZYRTEC PO) Take by mouth daily.     VITAMIN D  PO Take 1 capsule by mouth daily.     triamterene -hydrochlorothiazide (MAXZIDE-25) 37.5-25 MG tablet 1/2 tab po bid 30 tablet 0   metoprolol  succinate (TOPROL  XL) 25 MG 24 hr tablet Take 1 tablet (25 mg total) by mouth daily. (Patient not taking: Reported on 05/22/2024) 30 tablet 2   No facility-administered medications prior to visit.    Allergies  Allergen Reactions    Egg-Derived Products     Review of Systems As per HPI  PE:    05/22/2024    1:07 PM 01/28/2024    3:22 PM 07/14/2022    4:07 PM  Vitals with BMI  Height 5' 4 5' 4   Weight 168 lbs 6 oz 176 lbs 6 oz   BMI 28.89 30.26   Systolic 114 114 860  Diastolic 80 59 82  Pulse 91 87 70     Physical Exam  Gen: Alert, well appearing.  Patient is oriented to person, place, time, and situation. AFFECT: pleasant, lucid thought and speech. PERRL.  EOMI.  No nystagmus. No tremor.   LABS:  Last CBC Lab Results  Component Value Date   WBC 6.0 11/02/2018   HGB 14.6 11/02/2018   HCT 42.8 11/02/2018   MCV 99.5 11/02/2018   MCH 32.8 01/26/2014   RDW 13.3 11/02/2018   PLT 316.0 11/02/2018   Last metabolic panel Lab Results  Component Value Date   GLUCOSE 98 05/22/2024   NA 135 05/22/2024   K 4.3 05/22/2024   CL 102 05/22/2024   CO2 23 05/22/2024   BUN 14 05/22/2024   CREATININE 0.78 05/22/2024   EGFR 87 07/08/2022   CALCIUM 10.2 05/22/2024   PROT 6.9 11/02/2018   ALBUMIN 4.5 11/02/2018   LABGLOB 2.3 11/06/2014   AGRATIO 1.9 11/06/2014  BILITOT 0.6 11/02/2018   ALKPHOS 63 11/02/2018   AST 18 11/02/2018   ALT 17 11/02/2018   Last lipids Lab Results  Component Value Date   CHOL 223 (H) 07/08/2022   HDL 49 07/08/2022   LDLCALC 157 (H) 07/08/2022   TRIG 97 07/08/2022   CHOLHDL 4 11/02/2018   Last thyroid  functions Lab Results  Component Value Date   TSH 1.96 04/01/2022   T4TOTAL 8.5 11/06/2014   Last vitamin D  Lab Results  Component Value Date   VD25OH 71.21 11/02/2018   IMPRESSION AND PLAN:  Mnire's disease suspected. Although she has not seen any significant improvement on the maxzide she would like to continue on this medication for now--> 37.5-25, one half twice daily. Refer to ENT for further expertise. Check basic metabolic panel today.  An After Visit Summary was printed and given to the patient.  FOLLOW UP: Return in about 2 months (around  07/22/2024) for f/u vertigo.  Signed:  Gerlene Hockey, MD           05/22/2024

## 2024-05-23 ENCOUNTER — Ambulatory Visit: Payer: Self-pay | Admitting: Family Medicine

## 2024-07-06 DIAGNOSIS — H9042 Sensorineural hearing loss, unilateral, left ear, with unrestricted hearing on the contralateral side: Secondary | ICD-10-CM | POA: Diagnosis not present

## 2024-07-06 DIAGNOSIS — H9312 Tinnitus, left ear: Secondary | ICD-10-CM | POA: Diagnosis not present

## 2024-07-06 DIAGNOSIS — H903 Sensorineural hearing loss, bilateral: Secondary | ICD-10-CM | POA: Diagnosis not present

## 2024-07-06 DIAGNOSIS — R42 Dizziness and giddiness: Secondary | ICD-10-CM | POA: Diagnosis not present

## 2024-07-19 DIAGNOSIS — R42 Dizziness and giddiness: Secondary | ICD-10-CM | POA: Diagnosis not present

## 2024-07-19 DIAGNOSIS — H903 Sensorineural hearing loss, bilateral: Secondary | ICD-10-CM | POA: Diagnosis not present

## 2024-07-25 ENCOUNTER — Telehealth: Payer: Self-pay

## 2024-07-25 NOTE — Telephone Encounter (Signed)
 Source  Janice Blanchard (Patient)   Subject  Janice Blanchard (Patient)   Topic  Clinical - Lab/Test Results    Communication  Reason for CRM: Patient is calling for the lab results on her MRI. She is requesting a call back to go over the results    Cb# [phone]    Pt was referred to ENT and had MRI completed on 11/12. She has not heard any updates from ENT regarding results and would rather have PCP review and discuss with her. She was made aware PCP out of the office for remainder of the day

## 2024-07-26 ENCOUNTER — Telehealth: Payer: Self-pay | Admitting: Family Medicine

## 2024-07-26 NOTE — Telephone Encounter (Unsigned)
 Copied from CRM [id number]. Topic: Clinical - Medication Refill >> Jul 26, 2024  2:04 PM Roselie BROCKS wrote: Medication: patient needs refill on Dialectic medication    Has the patient contacted their pharmacy? No (Agent: If no, request that the patient contact the pharmacy for the refill. If patient does not wish to contact the pharmacy document the reason why and proceed with request.) (Agent: If yes, when and what did the pharmacy advise?)  This is the patient's preferred pharmacy:  [id number] - [place], [place] - [address] [place] [address] Phone: [phone] Fax: [phone]  Is this the correct pharmacy for this prescription? Yes If no, delete pharmacy and type the correct one.   Has the prescription been filled recently? Yes  Is the patient out of the medication? No  Has the patient been seen for an appointment in the last year OR does the patient have an upcoming appointment? Yes  Can we respond through MyChart? No  Agent: Please be advised that Rx refills may take up to 3 business days. We ask that you follow-up with your pharmacy.

## 2024-07-27 ENCOUNTER — Telehealth: Admitting: Family Medicine

## 2024-07-27 ENCOUNTER — Encounter: Payer: Self-pay | Admitting: Family Medicine

## 2024-07-27 DIAGNOSIS — E236 Other disorders of pituitary gland: Secondary | ICD-10-CM

## 2024-07-27 DIAGNOSIS — H903 Sensorineural hearing loss, bilateral: Secondary | ICD-10-CM

## 2024-07-27 DIAGNOSIS — H9319 Tinnitus, unspecified ear: Secondary | ICD-10-CM

## 2024-07-27 DIAGNOSIS — R42 Dizziness and giddiness: Secondary | ICD-10-CM

## 2024-07-27 DIAGNOSIS — R9089 Other abnormal findings on diagnostic imaging of central nervous system: Secondary | ICD-10-CM | POA: Diagnosis not present

## 2024-07-27 MED ORDER — TRIAMTERENE-HCTZ 37.5-25 MG PO TABS: ORAL_TABLET | ORAL | 1 refills | Status: DC

## 2024-07-27 NOTE — Telephone Encounter (Signed)
 Reviewed call to get clarity on the requested medication. Patient states meds not ordered by PCP previously, but she was not able to get someone at the prescriber's office to answer the phone. Patient requesting refill of diuretic medication and PCP will know what it is.

## 2024-07-27 NOTE — Progress Notes (Signed)
 Virtual Visit via Video Note  I connected with Janice Blanchard  on 07/27/24 at 11:00 AM EST by a video enabled telemedicine application and verified that I am speaking with the correct person using two identifiers.  Location patient: [place] Location provider:work or home office Persons participating in the virtual visit: patient, provider  I discussed the limitations and requested verbal permission for telemedicine visit. The patient expressed understanding and agreed to proceed.    Patient is a 58 y.o. female who presents for discuss recent brain MRI results.  HPI: She saw ENT 07/06/2024 for chronic vertigo, possible Mnire's disease. MRI of internal auditory canals for evaluation of possible retrocochlear pathology was done on 07/19/2024.  She has been worried because she has not received a callback from ENT about her MRI yet. We reviewed the results--> enlarged partially empty sella.  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  Scattered T2/FLAIR hyperintensity in the periventricular and subcortical white matter and pons. This is a constellation of nonspecific findings which may be seen in the setting of idiopathic intracranial hypertension.  Otherwise, no findings to explain her symptoms.  We reviewed her symptoms briefly again: Chronic vertigo, unilateral hearing loss, and tinnitus. She has a pretty constant feeling of what she describes as a pressure in her head but not exactly a typical headache per her description.  She has no visual abnormalities. No nausea or vomiting.  No gait abnormality.  She reports that she has had only 1 episode of her vertigo in the last 5 weeks since getting on the Maxide.  ROS: See pertinent positives and negatives per HPI.  Past Medical History:  Diagnosis Date   PAC (premature atrial contraction)    symptomatic.  Holter monitor showed PACs.  Never took meds.  Spontaneously resolved. CT coronary morph 2023 NO SIGNIF CAD   Shingles    Face  2021 and 2023   Vitamin D  deficiency     Past Surgical History:  Procedure Laterality Date   CESAREAN SECTION     2003 and 1992   TRANSTHORACIC ECHOCARDIOGRAM     06/24/22 mild MR and TR     Current Outpatient Medications:    Cetirizine HCl (ZYRTEC PO), Take by mouth daily., Disp: , Rfl:    VITAMIN D  PO, Take 1 capsule by mouth daily., Disp: , Rfl:    metoprolol  succinate (TOPROL  XL) 25 MG 24 hr tablet, Take 1 tablet (25 mg total) by mouth daily. (Patient not taking: Reported on 07/27/2024), Disp: 30 tablet, Rfl: 2   triamterene -hydrochlorothiazide (MAXZIDE-25) 37.5-25 MG tablet, 1/2 tab po bid, Disp: 30 tablet, Rfl: 1  EXAM:  VITALS per patient if applicable:     05/22/2024    1:07 PM 01/28/2024    3:22 PM 07/14/2022    4:07 PM  Vitals with BMI  Height 5' 4 5' 4   Weight 168 lbs 6 oz 176 lbs 6 oz   BMI 28.89 30.26   Systolic 114 114 860  Diastolic 80 59 82  Pulse 91 87 70     GENERAL: alert, oriented, appears well and in no acute distress  HEENT: atraumatic, conjunttiva clear, no obvious abnormalities on inspection of external nose and ears  NECK: normal movements of the head and neck  LUNGS: on inspection no signs of respiratory distress, breathing rate appears normal, no obvious gross SOB, gasping or wheezing  CV: no obvious cyanosis  MS: moves all visible extremities without noticeable abnormality  PSYCH/NEURO: pleasant and cooperative, no obvious depression or  anxiety, speech and thought processing grossly intact  LABS: none  ASSESSMENT AND PLAN:  Discussed the following assessment and plan:  Chronic vertigo, unilateral hearing loss, and tinnitus. Mnire's disease suspected--> she reports decrease in her severe episodes since starting Maxzide 37.5-25 mg, one half tab twice a day. Recent ENT evaluation showed abnormal MRI internal auditory canals and posterior fossa with and without contrast--> it showed nonspecific constellation of findings that can be  seen with idiopathic intracranial hypertension. I do not think her symptoms clinically correlate with the imaging abnormality, but would like to have neurology evaluate her and see if any further imaging or other testing needs to be done. For now, continue Maxzide.  I discussed the assessment and treatment plan with the patient. The patient was provided an opportunity to ask questions and all were answered. The patient agreed with the plan and demonstrated an understanding of the instructions.   F/u: Will see her a couple of weeks after her neurology evaluation, earlier if new symptoms.  Signed:  Gerlene Hockey, MD           07/27/2024

## 2024-09-12 ENCOUNTER — Ambulatory Visit: Admitting: Neurology

## 2024-09-15 ENCOUNTER — Ambulatory Visit: Admitting: Neurology

## 2024-09-15 ENCOUNTER — Ambulatory Visit: Admitting: Diagnostic Neuroimaging

## 2024-09-15 ENCOUNTER — Encounter: Payer: Self-pay | Admitting: Neurology

## 2024-09-15 VITALS — BP 138/81 | HR 88 | Resp 15 | Ht 63.0 in | Wt 163.0 lb

## 2024-09-15 DIAGNOSIS — R42 Dizziness and giddiness: Secondary | ICD-10-CM | POA: Diagnosis not present

## 2024-09-15 DIAGNOSIS — G43E09 Chronic migraine with aura, not intractable, without status migrainosus: Secondary | ICD-10-CM | POA: Diagnosis not present

## 2024-09-15 MED ORDER — ONDANSETRON 4 MG PO TBDP: 4.0000 mg | ORAL_TABLET | Freq: Three times a day (TID) | ORAL | 5 refills | Status: AC | PRN

## 2024-09-15 MED ORDER — RIZATRIPTAN BENZOATE 5 MG PO TBDP: 5.0000 mg | ORAL_TABLET | ORAL | 6 refills | Status: AC | PRN

## 2024-09-15 MED ORDER — TOPIRAMATE 50 MG PO TABS: 100.0000 mg | ORAL_TABLET | Freq: Every evening | ORAL | 11 refills | Status: AC

## 2024-09-15 NOTE — Patient Instructions (Addendum)
 Meds ordered this encounter  Medications   topiramate  (TOPAMAX ) 50 MG tablet    Sig: Take 2 tablets (100 mg total) by mouth at bedtime. One every night xone week, then 2 tabs every night    Dispense:  60 tablet    Refill:  11   rizatriptan  (MAXALT -MLT) 5 MG disintegrating tablet    Sig: Take 1 tablet (5 mg total) by mouth as needed. May repeat in 2 hours if needed    Dispense:  10 tablet    Refill:  6   ondansetron  (ZOFRAN -ODT) 4 MG disintegrating tablet    Sig: Take 1 tablet (4 mg total) by mouth every 8 (eight) hours as needed.    Dispense:  20 tablet    Refill:  5

## 2024-09-15 NOTE — Progress Notes (Signed)
 "  Chief Complaint  Patient presents with   New Patient (Initial Visit)    Emg Rm 2, daughter present, NP internal referral for Abnormal finding on MRI of brain, empty sella, vertigo      ASSESSMENT AND PLAN  Janice Blanchard is a 59 y.o. female   Hx of migraine with aura Recurrent episodes of vertigo  DDx migraine variant, meniere's disease  Topamax  50mg  at bedtime titrating to 100mg  at bedtime as preventive medications  Maxalt  as needed  Return in 2-4 months  DIAGNOSTIC DATA (LABS, IMAGING, TESTING) - I reviewed patient records, labs, notes, testing and imaging myself where available.   MEDICAL HISTORY:  Janice Blanchard is a 59 year old female accompanied by her daughter, seen in request by her primary care doctor Candise Mungo for evaluation of acute onset vertigo, chronic migraine   History is obtained from the patient and review of electronic medical records. I personally reviewed pertinent available imaging films in PACS.   PMHx of  Smoke 1.5ppd  She had long history of chronic migraine, occasional migraine proceeding with visual aura then severe lateralized pounding headache with light noise sensitivity  In May 2025, she was helping cutting down a tree, repetitive bending over to pick up tree limbs, then she had a sudden onset vertigo, it was so intense, she felt nausea, very uncomfortable, took Dramamine, did put her into sleep  For about 2 weeks, she had a multiple similar recurrent intense vertigo, with each episode she took Dramamine, which will put her into sleep, she denies significant headache, to complains of worsening left tinnitus during vertigo episode, when she is awake from sleep, her symptoms usually improved  She was seen by [facility], Audiology evaluation July 06, 2024, hearing evaluation reviewed within normal sensitivity at the right ear, in the left, threshold was borderline normal 4 to 5500 Hz, then mild sensorineural hearing loss at 1000  Hz sloping to severe sensorineural hearing loss through 8000 hertz  MRI of internal acoustic canal w/wo on Jul 19 2024. 1.  Constellation of nonspecific findings above, which may be seen in the setting of idiopathic intracranial hypertension (IIH).  2.  Otherwise, no findings to explain patient's reported symptoms.  3.  Nonspecific white matter signal changes, favored to reflect sequelae of chronic small vessel ischemic disease.   She had more episode in December, is often related to the sudden positional change, to noticed increased tinnitus may have mild worsening left hearing loss during the episode  Reported 1 episode 3 years ago, she was playing with her grandson, prone forward she had sudden onset but much shorter lasting vertigo  PHYSICAL EXAM:   Vitals:   09/15/24 0944  BP: 138/81  Pulse: 88  Resp: 15  SpO2: 97%  Weight: 163 lb (73.9 kg)  Height: 5' 3 (1.6 m)   Body mass index is 28.87 kg/m.  PHYSICAL EXAMNIATION:  Gen: NAD, conversant, well nourised, well groomed                     Cardiovascular: Regular rate rhythm, no peripheral edema, warm, nontender. Eyes: Conjunctivae clear without exudates or hemorrhage Neck: Supple, no carotid bruits. Pulmonary: Clear to auscultation bilaterally   NEUROLOGICAL EXAM:  MENTAL STATUS: Speech/cognition: Awake, alert, oriented to history taking and casual conversation CRANIAL NERVES: CN II: Visual fields are full to confrontation. Pupils are round equal and briskly reactive to light. CN III, IV, VI: extraocular movement are normal. No ptosis. CN V: Facial  sensation is intact to light touch CN VII: Face is symmetric with normal eye closure  CN VIII: Hearing is normal to causal conversation. CN IX, X: Phonation is normal. CN XI: Head turning and shoulder shrug are intact  MOTOR: There is no pronator drift of out-stretched arms. Muscle bulk and tone are normal. Muscle strength is normal.  REFLEXES: Reflexes are 2+ and  symmetric at the biceps, triceps, knees, and ankles. Plantar responses are flexor.  SENSORY: Intact to light touch, pinprick and vibratory sensation are intact in fingers and toes.  COORDINATION: There is no trunk or limb dysmetria noted.  GAIT/STANCE: Posture is normal. Gait is steady   REVIEW OF SYSTEMS:  Full 14 system review of systems performed and notable only for as above All other review of systems were negative.   ALLERGIES: Allergies[1]  HOME MEDICATIONS: Current Outpatient Medications  Medication Sig Dispense Refill   Cetirizine HCl (ZYRTEC PO) Take by mouth daily.     triamterene -hydrochlorothiazide (MAXZIDE-25) 37.5-25 MG tablet 1/2 tab po bid 30 tablet 1   VITAMIN D  PO Take 1 capsule by mouth daily.     No current facility-administered medications for this visit.    PAST MEDICAL HISTORY: Past Medical History:  Diagnosis Date   PAC (premature atrial contraction)    symptomatic.  Holter monitor showed PACs.  Never took meds.  Spontaneously resolved. CT coronary morph 2023 NO SIGNIF CAD   Shingles    Face 2021 and 2023   Vitamin D  deficiency     PAST SURGICAL HISTORY: Past Surgical History:  Procedure Laterality Date   CESAREAN SECTION     2003 and 1992   TRANSTHORACIC ECHOCARDIOGRAM     06/24/22 mild MR and TR    FAMILY HISTORY: Family History  Problem Relation Age of Onset   Alcohol abuse Father    Cancer Father    Deafness Daughter    Hepatitis Daughter        autoimmune   Alzheimer's disease Maternal Grandfather    Alzheimer's disease Mother    Diabetes Paternal Grandmother    Diabetes Paternal Grandfather    Cancer Paternal Grandfather     SOCIAL HISTORY: Social History   Socioeconomic History   Marital status: Married    Spouse name: Not on file   Number of children: Not on file   Years of education: Not on file   Highest education level: Not on file  Occupational History   Not on file  Tobacco Use   Smoking status: Every Day     Current packs/day: 0.00    Average packs/day: 1.5 packs/day for 20.0 years (30.0 ttl pk-yrs)    Types: Cigarettes    Start date: 01/05/1989    Last attempt to quit: 01/05/2009    Years since quitting: 15.7    Passive exposure: Never   Smokeless tobacco: Never   Tobacco comments:    Quit on 01/05/09 x 37yr then started back  Vaping Use   Vaping status: Never Used  Substance and Sexual Activity   Alcohol use: No   Drug use: No   Sexual activity: Not Currently  Other Topics Concern   Not on file  Social History Narrative   Married, 5 children.   Homemaker.   Orig from [place] .   Currently living in [place], [place].   Tob: 35 pack-yr hx, current as of 10/2018.   No alcohol or drugs.   Exercise: no formal exercise but not sedentary.   Social Drivers of Health  Tobacco Use: High Risk (09/15/2024)   Patient History    Smoking Tobacco Use: Every Day    Smokeless Tobacco Use: Never    Passive Exposure: Never  Financial Resource Strain: Not on file  Food Insecurity: Not on file  Transportation Needs: Not on file  Physical Activity: Not on file  Stress: Not on file  Social Connections: Not on file  Intimate Partner Violence: Not on file  Depression (EYV7-0): Low Risk (01/28/2024)   Depression (PHQ2-9)    PHQ-2 Score: 0  Alcohol Screen: Not on file  Housing: Not on file  Utilities: Not on file  Health Literacy: Not on file      Modena Callander, M.D. Ph.D.  [facility] [address], [address] [place], [place] [address] Ph: [phone] Fax: [phone]  CC:  McGowen, Philip H, MD [address] [place],  [place] [address]  Candise Aleene DEL, MD       [1]  Allergies Allergen Reactions   Egg Protein-Containing Drug Products    "

## 2024-09-26 ENCOUNTER — Other Ambulatory Visit: Payer: Self-pay | Admitting: Family Medicine

## 2025-01-22 ENCOUNTER — Ambulatory Visit: Admitting: Neurology
